# Patient Record
Sex: Female | Born: 1960 | Race: White | Hispanic: No | Marital: Married | Smoking: Current every day smoker
Health system: Southern US, Community
[De-identification: ages and names within clinical notes are randomized; demographics above are authoritative.]

## PROBLEM LIST (undated history)

## (undated) DIAGNOSIS — Z9189 Other specified personal risk factors, not elsewhere classified: Secondary | ICD-10-CM

## (undated) DIAGNOSIS — I82409 Acute embolism and thrombosis of unspecified deep veins of unspecified lower extremity: Secondary | ICD-10-CM

## (undated) DIAGNOSIS — G43909 Migraine, unspecified, not intractable, without status migrainosus: Secondary | ICD-10-CM

## (undated) DIAGNOSIS — E049 Nontoxic goiter, unspecified: Secondary | ICD-10-CM

## (undated) DIAGNOSIS — I10 Essential (primary) hypertension: Secondary | ICD-10-CM

## (undated) DIAGNOSIS — R109 Unspecified abdominal pain: Secondary | ICD-10-CM

## (undated) DIAGNOSIS — F319 Bipolar disorder, unspecified: Secondary | ICD-10-CM

## (undated) DIAGNOSIS — J301 Allergic rhinitis due to pollen: Secondary | ICD-10-CM

## (undated) DIAGNOSIS — E039 Hypothyroidism, unspecified: Secondary | ICD-10-CM

## (undated) DIAGNOSIS — F419 Anxiety disorder, unspecified: Secondary | ICD-10-CM

## (undated) DIAGNOSIS — T7840XA Allergy, unspecified, initial encounter: Secondary | ICD-10-CM

## (undated) DIAGNOSIS — I2510 Atherosclerotic heart disease of native coronary artery without angina pectoris: Secondary | ICD-10-CM

## (undated) DIAGNOSIS — F17200 Nicotine dependence, unspecified, uncomplicated: Secondary | ICD-10-CM

## (undated) DIAGNOSIS — R232 Flushing: Secondary | ICD-10-CM

## (undated) DIAGNOSIS — K219 Gastro-esophageal reflux disease without esophagitis: Secondary | ICD-10-CM

## (undated) HISTORY — DX: Nontoxic goiter, unspecified: E04.9

## (undated) HISTORY — DX: Anxiety disorder, unspecified: F41.9

## (undated) HISTORY — DX: Migraine, unspecified, not intractable, without status migrainosus: G43.909

## (undated) HISTORY — DX: Flushing: R23.2

## (undated) HISTORY — DX: Other specified personal risk factors, not elsewhere classified: Z91.89

## (undated) HISTORY — DX: Gastro-esophageal reflux disease without esophagitis: K21.9

## (undated) HISTORY — PX: THYROIDECTOMY: SHX17

## (undated) HISTORY — DX: Essential (primary) hypertension: I10

## (undated) HISTORY — DX: Nicotine dependence, unspecified, uncomplicated: F17.200

## (undated) HISTORY — DX: Allergic rhinitis due to pollen: J30.1

## (undated) HISTORY — DX: Allergy, unspecified, initial encounter: T78.40XA

## (undated) HISTORY — DX: Unspecified abdominal pain: R10.9

---

## 2003-04-22 ENCOUNTER — Other Ambulatory Visit: Admission: RE | Admit: 2003-04-22 | Discharge: 2003-04-22 | Payer: Self-pay | Admitting: Family Medicine

## 2003-05-06 ENCOUNTER — Encounter: Admission: RE | Admit: 2003-05-06 | Discharge: 2003-05-06 | Payer: Self-pay | Admitting: Family Medicine

## 2003-05-06 ENCOUNTER — Encounter: Payer: Self-pay | Admitting: Family Medicine

## 2006-06-06 ENCOUNTER — Ambulatory Visit: Admission: RE | Admit: 2006-06-06 | Discharge: 2006-06-06 | Payer: Self-pay | Admitting: Gynecologic Oncology

## 2006-10-26 ENCOUNTER — Ambulatory Visit: Payer: Self-pay | Admitting: Internal Medicine

## 2007-07-13 ENCOUNTER — Encounter: Payer: Self-pay | Admitting: *Deleted

## 2007-07-13 DIAGNOSIS — K219 Gastro-esophageal reflux disease without esophagitis: Secondary | ICD-10-CM | POA: Insufficient documentation

## 2007-07-13 DIAGNOSIS — G43909 Migraine, unspecified, not intractable, without status migrainosus: Secondary | ICD-10-CM | POA: Insufficient documentation

## 2007-07-13 DIAGNOSIS — J301 Allergic rhinitis due to pollen: Secondary | ICD-10-CM | POA: Insufficient documentation

## 2007-07-13 DIAGNOSIS — E049 Nontoxic goiter, unspecified: Secondary | ICD-10-CM | POA: Insufficient documentation

## 2007-07-13 DIAGNOSIS — F411 Generalized anxiety disorder: Secondary | ICD-10-CM | POA: Insufficient documentation

## 2007-07-13 DIAGNOSIS — T7840XA Allergy, unspecified, initial encounter: Secondary | ICD-10-CM | POA: Insufficient documentation

## 2007-10-18 ENCOUNTER — Ambulatory Visit: Payer: Self-pay | Admitting: Internal Medicine

## 2007-10-18 DIAGNOSIS — R109 Unspecified abdominal pain: Secondary | ICD-10-CM | POA: Insufficient documentation

## 2007-10-18 DIAGNOSIS — I10 Essential (primary) hypertension: Secondary | ICD-10-CM | POA: Insufficient documentation

## 2007-10-18 DIAGNOSIS — R232 Flushing: Secondary | ICD-10-CM | POA: Insufficient documentation

## 2007-10-18 LAB — CONVERTED CEMR LAB
Free T4: 0.7 ng/dL (ref 0.6–1.6)
PTH: 31.6 pg/mL (ref 14.0–72.0)
T3, Free: 3.2 pg/mL (ref 2.3–4.2)

## 2007-10-21 ENCOUNTER — Encounter: Payer: Self-pay | Admitting: Internal Medicine

## 2007-10-21 LAB — CONVERTED CEMR LAB
Catecholamines Tot(E+NE) 24 Hr U: 0.051 mg/24hr
Dopamine 24 Hr Urine: 256 mcg/24hr (ref <500)
Metanephrines, Ur: 143 (ref 58–203)

## 2007-10-23 ENCOUNTER — Encounter: Payer: Self-pay | Admitting: Internal Medicine

## 2007-10-24 ENCOUNTER — Encounter: Payer: Self-pay | Admitting: Internal Medicine

## 2007-10-28 ENCOUNTER — Telehealth: Payer: Self-pay | Admitting: Internal Medicine

## 2007-11-25 ENCOUNTER — Telehealth: Payer: Self-pay | Admitting: Internal Medicine

## 2008-07-22 ENCOUNTER — Telehealth (INDEPENDENT_AMBULATORY_CARE_PROVIDER_SITE_OTHER): Payer: Self-pay | Admitting: *Deleted

## 2009-01-19 ENCOUNTER — Encounter: Payer: Self-pay | Admitting: Internal Medicine

## 2009-02-18 ENCOUNTER — Encounter: Admission: RE | Admit: 2009-02-18 | Discharge: 2009-02-18 | Payer: Self-pay | Admitting: Obstetrics and Gynecology

## 2009-02-18 ENCOUNTER — Encounter: Payer: Self-pay | Admitting: Internal Medicine

## 2009-02-22 LAB — CONVERTED CEMR LAB: Pap Smear: NORMAL

## 2009-04-30 ENCOUNTER — Ambulatory Visit: Payer: Self-pay | Admitting: Internal Medicine

## 2009-04-30 DIAGNOSIS — F17200 Nicotine dependence, unspecified, uncomplicated: Secondary | ICD-10-CM

## 2009-07-24 HISTORY — PX: CARDIAC CATHETERIZATION: SHX172

## 2009-08-02 ENCOUNTER — Emergency Department (HOSPITAL_COMMUNITY): Admission: EM | Admit: 2009-08-02 | Discharge: 2009-08-03 | Payer: Self-pay | Admitting: Emergency Medicine

## 2009-08-05 ENCOUNTER — Ambulatory Visit: Payer: Self-pay | Admitting: Internal Medicine

## 2009-08-05 LAB — CONVERTED CEMR LAB
Calcium: 9.6 mg/dL (ref 8.4–10.5)
Chloride: 102 meq/L (ref 96–112)
GFR calc non Af Amer: 81.59 mL/min (ref >60)
Glucose, Bld: 97 mg/dL (ref 70–99)

## 2009-08-06 ENCOUNTER — Telehealth: Payer: Self-pay | Admitting: Internal Medicine

## 2009-08-09 ENCOUNTER — Telehealth: Payer: Self-pay | Admitting: Internal Medicine

## 2009-08-09 ENCOUNTER — Encounter: Payer: Self-pay | Admitting: Internal Medicine

## 2009-08-13 ENCOUNTER — Telehealth: Payer: Self-pay | Admitting: Internal Medicine

## 2009-08-25 ENCOUNTER — Encounter: Admission: RE | Admit: 2009-08-25 | Discharge: 2009-08-25 | Payer: Self-pay | Admitting: Obstetrics and Gynecology

## 2009-10-11 ENCOUNTER — Ambulatory Visit (HOSPITAL_COMMUNITY): Admission: RE | Admit: 2009-10-11 | Discharge: 2009-10-12 | Payer: Self-pay | Admitting: Surgery

## 2009-10-11 ENCOUNTER — Encounter (INDEPENDENT_AMBULATORY_CARE_PROVIDER_SITE_OTHER): Payer: Self-pay | Admitting: Surgery

## 2009-10-25 ENCOUNTER — Encounter: Payer: Self-pay | Admitting: Internal Medicine

## 2009-11-05 ENCOUNTER — Telehealth: Payer: Self-pay | Admitting: Internal Medicine

## 2009-12-06 ENCOUNTER — Encounter: Payer: Self-pay | Admitting: Internal Medicine

## 2009-12-08 ENCOUNTER — Telehealth: Payer: Self-pay | Admitting: Internal Medicine

## 2009-12-10 ENCOUNTER — Telehealth: Payer: Self-pay | Admitting: Internal Medicine

## 2009-12-21 ENCOUNTER — Encounter: Payer: Self-pay | Admitting: Internal Medicine

## 2009-12-21 ENCOUNTER — Telehealth: Payer: Self-pay | Admitting: Internal Medicine

## 2009-12-31 ENCOUNTER — Telehealth: Payer: Self-pay | Admitting: Internal Medicine

## 2010-02-08 ENCOUNTER — Telehealth: Payer: Self-pay | Admitting: Internal Medicine

## 2010-02-09 ENCOUNTER — Emergency Department (HOSPITAL_COMMUNITY): Admission: EM | Admit: 2010-02-09 | Discharge: 2010-02-09 | Payer: Self-pay | Admitting: Emergency Medicine

## 2010-02-09 ENCOUNTER — Encounter: Payer: Self-pay | Admitting: Internal Medicine

## 2010-02-11 ENCOUNTER — Ambulatory Visit: Payer: Self-pay | Admitting: Internal Medicine

## 2010-02-11 DIAGNOSIS — E890 Postprocedural hypothyroidism: Secondary | ICD-10-CM

## 2010-02-24 ENCOUNTER — Ambulatory Visit: Payer: Self-pay | Admitting: Internal Medicine

## 2010-02-24 ENCOUNTER — Telehealth: Payer: Self-pay | Admitting: Internal Medicine

## 2010-02-24 ENCOUNTER — Inpatient Hospital Stay (HOSPITAL_COMMUNITY): Admission: EM | Admit: 2010-02-24 | Discharge: 2010-02-26 | Payer: Self-pay | Admitting: Internal Medicine

## 2010-02-25 ENCOUNTER — Ambulatory Visit: Payer: Self-pay | Admitting: Internal Medicine

## 2010-03-07 ENCOUNTER — Encounter: Admission: RE | Admit: 2010-03-07 | Discharge: 2010-03-07 | Payer: Self-pay | Admitting: Internal Medicine

## 2010-03-07 LAB — HM MAMMOGRAPHY: HM Mammogram: NEGATIVE

## 2010-04-22 ENCOUNTER — Telehealth: Payer: Self-pay | Admitting: Internal Medicine

## 2010-05-18 ENCOUNTER — Ambulatory Visit: Payer: Self-pay | Admitting: Internal Medicine

## 2010-06-28 ENCOUNTER — Telehealth: Payer: Self-pay | Admitting: Internal Medicine

## 2010-06-28 ENCOUNTER — Ambulatory Visit: Payer: Self-pay | Admitting: Internal Medicine

## 2010-06-28 ENCOUNTER — Encounter: Payer: Self-pay | Admitting: Internal Medicine

## 2010-06-28 ENCOUNTER — Inpatient Hospital Stay (HOSPITAL_COMMUNITY)
Admission: EM | Admit: 2010-06-28 | Discharge: 2010-06-30 | Payer: Self-pay | Source: Home / Self Care | Attending: Cardiology | Admitting: Cardiology

## 2010-06-28 DIAGNOSIS — R0789 Other chest pain: Secondary | ICD-10-CM

## 2010-06-30 ENCOUNTER — Encounter: Payer: Self-pay | Admitting: Cardiology

## 2010-07-01 ENCOUNTER — Telehealth: Payer: Self-pay | Admitting: Internal Medicine

## 2010-07-01 ENCOUNTER — Telehealth: Payer: Self-pay | Admitting: Cardiovascular Disease

## 2010-07-05 ENCOUNTER — Ambulatory Visit: Payer: Self-pay | Admitting: Internal Medicine

## 2010-07-28 IMAGING — RF DG FLUORO GUIDE NDL PLC/BX
1 series · 1 of 1 positions shown · non-contrast
Comparison: none

CLINICAL DATA: 48-year-old female with severe headache.

DIAGNOSTIC LUMBAR PUNCTURE UNDER FLUOROSCOPIC GUIDANCE
TECHNIQUE: Informed consent was obtained from the patient prior to
the procedure, including potential complications of headache,
allergy, and pain.   A "time-out" was performed.  With the patient
prone, the lower back was prepped and draped in the usual sterile
fashion with Betadine.  1% Lidocaine was used for local anesthesia.
Using fluoroscopic guidance, lumbar puncture was performed at the
L2-L3 level using right sublaminar technique.

[Series 1: run · 1 of 1 slices shown]
[im 1/1]
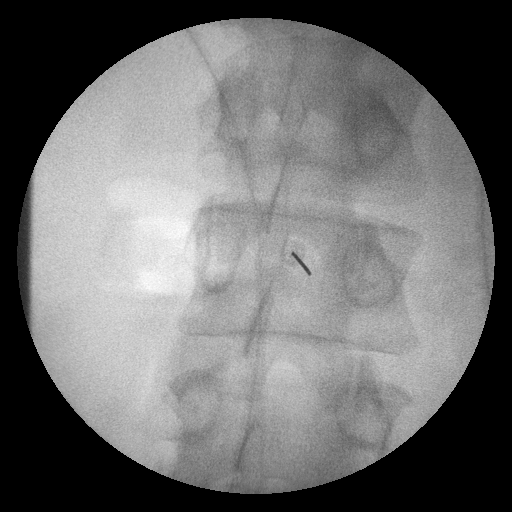

[1 of 1 positions shown; findings below may reference images not displayed]

A 3.5 inches x 20 gauge spinal needle was advanced into the thecal
sac with return of clear, colorless CSF.    Opening pressure was
measured to be 21 cm of water.  11 mL of CSF were collected into 4
tubes for laboratory studies.

The patient tolerated the procedure well and there were no apparent
complications.  The patient was given appropriate post procedural
instructions and return to the emergency department for continued
care.
IMPRESSION: Fluoroscopic-guided lumbar puncture at L2-L3 with collection of CSF
which was transported to the lab for analysis.

## 2010-07-29 ENCOUNTER — Ambulatory Visit: Admit: 2010-07-29 | Payer: Self-pay | Admitting: Internal Medicine

## 2010-08-23 ENCOUNTER — Telehealth: Payer: Self-pay | Admitting: Internal Medicine

## 2010-08-23 NOTE — Procedures (Signed)
Summary: Upper Endoscopy  Patient: [name] Note: All result statuses are Final unless otherwise noted.  Tests: (1) Upper Endoscopy (EGD)   EGD Upper Endoscopy       DONE     [facility]     [address] [place], [place]  [address]           ENDOSCOPY PROCEDURE REPORT           PATIENT:  [name], [name]  MR#:  [id number]     BIRTHDATE:  03-14-1961, 48 yrs. old  GENDER:  female           ENDOSCOPIST:  Hedwig Morton. Juanda Chance, MD     Referred by:  Rosalyn Gess. Norins, M.D.           PROCEDURE DATE:  02/25/2010     PROCEDURE:  EGD, diagnostic     ASA CLASS:  Class II     INDICATIONS:  melena, hematochezia, abdominal pain           MEDICATIONS:   Versed 10 mg, Fentanyl 100 mcg, Benadryl 50 mg     TOPICAL ANESTHETIC:  Cetacaine Spray           DESCRIPTION OF PROCEDURE:   After the risks benefits and     alternatives of the procedure were thoroughly explained, informed     consent was obtained.  The  endoscope was introduced through the     mouth and advanced to the second portion of the duodenum, without     limitations.  The instrument was slowly withdrawn as the mucosa     was fully examined.     <<PROCEDUREIMAGES>>           The upper, middle, and distal third of the esophagus were     carefully inspected and no abnormalities were noted. The z-line     was well seen at the GEJ. The endoscope was pushed into the fundus     which was normal including a retroflexed view. The antrum,gastric     body, first and second part of the duodenum were unremarkable (see     image1, image2, image3, image4, image5, image6, and image7).     Retroflexed views revealed no abnormalities.    The scope was then     withdrawn from the patient and the procedure completed.     COMPLICATIONS:  None           ENDOSCOPIC IMPRESSION:     1) Normal EGD     nothing to account for dark stools, suspect lower GI source, r/o     ischemic colitis     RECOMMENDATIONS:     see chart for  recommendations           REPEAT EXAM:  In 0 year(s) for.           ______________________________     Hedwig Morton. Juanda Chance, MD           CC:           n.     eSIGNED:   Hedwig Morton. Lester Crickenberger at 02/25/2010 05:08 PM           Barnett Hatter, [phone]  Note: An exclamation mark (!) indicates a result that was not dispersed into the flowsheet. Document Creation Date: 02/25/2010 5:10 PM _______________________________________________________________________  (1) Order result status: Final Collection or observation date-time: 02/25/2010 17:01 Requested date-time:  Receipt date-time:  Reported date-time:  Referring Physician:  Ordering Physician: Lina Sar [phone]) Specimen Source:  Source: Launa Grill Order Number: [id number] Lab site:

## 2010-08-23 NOTE — Progress Notes (Signed)
  Phone Note Outgoing Call   Call placed by: Ami Bullins CMA,  Dec 10, 2009 2:46 PM Call placed to: Patient Summary of Call: pt called re. med refill. lmovm for pt to call back Initial call taken by: Ami Bullins CMA,  Dec 10, 2009 2:46 PM    Prescriptions: ZYRTEC ALLERGY 10 MG CAPS (CETIRIZINE HCL) one daily  #90 x 3   Entered by:   Ami Bullins CMA   Authorized by:   Jacques Navy MD   Signed by:   Bill Salinas CMA on 12/13/2009   Method used:   Electronically to        [facility] (retail)       [address]       [place], [place]  [address]       Ph: [phone] or [phone]       Fax: [phone]   RxID:   [id number] CLARINEX 5 MG TABS (DESLORATADINE) 1 once daily  #90 x 3   Entered by:   Bill Salinas CMA   Authorized by:   Jacques Navy MD   Signed by:   Bill Salinas CMA on 12/13/2009   Method used:   Electronically to        [facility] (retail)       [address]       [place], [place]  [address]       Ph: [phone] or [phone]       Fax: [phone]   RxID:   [id number]

## 2010-08-23 NOTE — Assessment & Plan Note (Signed)
Summary: ER FU ON BP /NWS   Vital Signs:  Patient profile:   50 year old female Height:      70 inches Weight:      198 pounds BMI:     28.51 O2 Sat:      97 % on Room air Temp:     96.7 degrees F oral Pulse rate:   74 / minute BP sitting:   156 / 100  (left arm) Cuff size:   large  Vitals Entered By: Bill Salinas CMA (August 05, 2009 10:47 AM)  O2 Flow:  Room air CC: pt here for ER follow up. Pt's primary diagnoses was a migraine headache. Pt states she has been expirancing elevated bp with dizziness and occasional heart palpatations. She does admitt that her job is very stressful/ ab   Primary Care Provider:  Jacques Navy MD  CC:  pt here for ER follow up. Pt's primary diagnoses was a migraine headache. Pt states she has been expirancing elevated bp with dizziness and occasional heart palpatations. She does admitt that her job is very stressful/ ab.  History of Present Illness: Patient presents for f/u after ED evaluation 08/03/09 for severe HA and HTN. she had CT brain - negative, LP negative, labs were normal except for a WBC 14.1 with normal differential. she had normal CXR, nl EKG. she was released with pain medication.  Since discharge she is still having a dull headache, palpitations, worsening numbness in her hands. Still taking percocet for pain but it is not relieving her HA but is causing nausea. She has not been monitoring her BP at home. She is not on BP meds - metoprolol was stopped due to HA.   Current Medications (verified): 1)  Alprazolam 1 Mg  Tabs (Alprazolam) .Marland Kitchen.. 1 By Mouth Q 8 As Needed 2)  Buspar 30 Mg  Tabs (Buspirone Hcl) .... Take 1/2 Tablet Two Times A Day 3)  Zyrtec Hives Relief 10 Mg Tabs (Cetirizine Hcl) .... 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 none  G0P0  Review of Systems       The patient complains of chest pain and headaches.  The patient denies anorexia, fever, weight loss, weight gain, vision loss, decreased hearing, hoarseness, syncope, dyspnea on exertion, peripheral edema, abdominal pain, hematochezia, severe indigestion/heartburn, incontinence, genital sores, suspicious skin lesions, depression, abnormal bleeding, and angioedema.    Physical Exam  General:  alert, well-developed, well-nourished, and well-hydrated.   Head:  normocephalic and atraumatic.   Eyes:  PERRLA, EOMI, fundi normal except for copper wiring. No disc abnormality Mouth:  pharynx pink and moist.   Neck:  large goiter Chest Wall:  No deformities, masses, or tenderness noted. Lungs:  Normal respiratory effort, chest expands symmetrically. Lungs are clear to auscultation, no crackles or wheezes. Heart:  Normal rate and regular rhythm. S1 and S2 normal without gallop, murmur, click, rub or other extra sounds. Neurologic:  alert & oriented X3, cranial nerves II-XII intact, gait normal, and DTRs symmetrical and normal.     Impression & Recommendations:  Problem # 1:  UNSPECIFIED ESSENTIAL HYPERTENSION (ICD-401.9) Primary problem is hypertension. She had a very thorough eval in the ED.  Plan - resume medication: CCCB and diuretic. Blood pressure check in 3-5 days.  The following medications were  removed from the medication list:    Metoprolol Tartrate 25 Mg Tabs (Metoprolol tartrate) .Marland Kitchen... 1 by mouth two times a day Her updated medication list for this problem includes:    Taztia Xt 240 Mg Xr24h-cap (Diltiazem hcl er beads) .Marland Kitchen... 1 by mouth once daily    Furosemide 20 Mg Tabs (Furosemide) .Marland Kitchen... 1 by mouth once daily  Orders: TLB-BMP (Basic Metabolic Panel-BMET) (80048-METABOL)  Problem # 2:  BLURRED VISION (ICD-368.8) Patient with blurred vision that is persistent. She has not been screened for DM. ER evaluation did not include chemistries.  Plan - Bmet and A1C  Addendum - A1C normal @5 .5% and normal chemistry.  Complete Medication List: 1)  Alprazolam 1 Mg Tabs (Alprazolam) .Marland Kitchen.. 1 by mouth q 8 as needed 2)  Buspar 30 Mg Tabs (Buspirone hcl) .... Take 1/2 tablet two times a day 3)  Zyrtec Hives Relief 10 Mg Tabs (Cetirizine hcl) .... Prn 4)  Taztia Xt 240 Mg Xr24h-cap (Diltiazem hcl er beads) .Marland Kitchen.. 1 by mouth once daily 5)  Furosemide 20 Mg Tabs (Furosemide) .Marland Kitchen.. 1 by mouth once daily  Other Orders: TLB-A1C / Hgb A1C (Glycohemoglobin) (83036-A1C) Prescriptions: FUROSEMIDE 20 MG TABS (FUROSEMIDE) 1 by mouth once daily  #30 x 12   Entered and Authorized by:   Jacques Navy MD   Signed by:   Tora Perches on 08/05/2009   Method used:   Electronically to        [facility] (retail)       [address]       [place], [place]  [address]       Ph: [phone] or [phone]       Fax: [phone]   RxID:   [id number] TAZTIA XT 240 MG XR24H-CAP (DILTIAZEM HCL ER BEADS) 1 by mouth once daily  #30 x 12   Entered and Authorized by:   Jacques Navy MD   Signed by:   Tora Perches on 08/05/2009   Method used:   Electronically to        [facility] (retail)       [address]       [place], [place]  [address]       Ph: [phone] or [phone]       Fax: [phone]   RxID:   [id number]

## 2010-08-23 NOTE — Letter (Signed)
   [facility] [address] [place], [place]  [address] Phone: [phone]      August 09, 2009   [facility] [address] [place], [place] [address]  RE:  LAB RESULTS  Dear  Ms. LARRANAGA,  The following is an interpretation of your most recent lab tests.  Please take note of any instructions provided or changes to medications that have resulted from your lab work.  ELECTROLYTES:  Good - no changes needed  KIDNEY FUNCTION TESTS:  Good - no changes needed  DIABETIC STUDIES:  Good - no changes needed Blood Glucose: 97   HgbA1C: 5.5     No blood sugar problem.  Hope you are doing better.    Sincerely Yours,    Jacques Navy MD

## 2010-08-23 NOTE — Assessment & Plan Note (Signed)
Summary: palpatations/chest pressure/SD   Vital Signs:  Patient profile:   50 year old female Height:      70 inches Weight:      196 pounds BMI:     28.22 O2 Sat:      98 % on Room air Temp:     98.5 degrees F oral Pulse rate:   58 / minute BP sitting:   120 / 70  (left arm) Cuff size:   regular  Vitals Entered By: Bill Salinas CMA (June 28, 2010 3:39 PM)  O2 Flow:  Room air CC: pt here with c/o heart palpatations with chest pain and pressure x 2 weeks/ ab   Primary Care Provider:  Jacques Navy MD  CC:  pt here with c/o heart palpatations with chest pain and pressure x 2 weeks/ ab.  History of Present Illness: Patient presents with a several day h/o intermittent chest pressure for several weeks but over the last 3 days she has more frequent and constant symptoms of chest pressure substernal region along with SOB,  but no diaphoresis, with mild jaw discomfort but no left arm pressure. She has increased discomfort with exertion. Her cardiac risks include HTN, mild weight problem. No family history, no lipid abnormality, going through menopause.   She is now for admission by [facility] cardiology to r/o for MI.  Current Medications (verified): 1)  Alprazolam 1 Mg  Tabs (Alprazolam) .Marland Kitchen.. 1 By Mouth Q 8 As Needed 2)  Zyrtec Allergy 10 Mg Caps (Cetirizine Hcl) .... One Daily 3)  Taztia Xt 360 Mg Xr24h-Cap (Diltiazem Hcl Er Beads) .Marland Kitchen.. 1 By Mouth Once Daily 4)  Furosemide 20 Mg Tabs (Furosemide) .Marland Kitchen.. 1 By Mouth Once Daily 5)  Levothyroxine Sodium 150 Mcg Tabs (Levothyroxine Sodium) .Marland Kitchen.. 1 By Mouth Once Daily 6)  Trazodone Hcl 50 Mg Tabs (Trazodone Hcl) .Marland Kitchen.. 1 By Mouth At Bedtime  Allergies (verified): No Known Drug Allergies  Past History:  Past Medical History: Last updated: 08/05/2009 TOBACCO ABUSE (ICD-305.1) ABDOMINAL PAIN, UNSPECIFIED SITE (ICD-789.00) FLUSHING (ICD-782.62) UNSPECIFIED ESSENTIAL HYPERTENSION (ICD-401.9) ALLERGY (ICD-995.3) ANXIETY  (ICD-300.00) GOITER (ICD-240.9) MIGRAINE HEADACHE (ICD-346.90) HAY FEVER (ICD-477.0) GERD (ICD-530.81) Hx of CHICKENPOX, HX OF AS CHILD (ICD-V15.9)      Past Surgical History: Last updated: 02/11/2010 Throidectomy March 11 (Dr. 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    Physical Exam  General:  WNWD whitre female who is uncomfortable Head:  Normocephalic and atraumatic without obvious abnormalities. No apparent alopecia or balding. Eyes:  No corneal or conjunctival inflammation noted. EOMI. Perrla. Funduscopic exam benign, without hemorrhages, exudates or papilledema. Vision grossly normal. Ears:  External ear exam shows no significant lesions or deformities.  Otoscopic examination reveals clear canals, tympanic membranes are intact bilaterally without bulging, retraction, inflammation or discharge. Hearing is grossly normal bilaterally. Neck:  supple, full ROM, and no thyromegaly.   Chest Wall:  No deformities, masses, or tenderness noted. Breasts:  deferred Lungs:  Normal respiratory effort, chest expands symmetrically. Lungs are clear to auscultation, no crackles or wheezes. Heart:  normal  rate, regular rhythm, no murmur, no JVD, and no HJR.   Abdomen:  soft, non-tender, normal bowel sounds, no guarding, and no rigidity.   Msk:  normal ROM, no joint tenderness, no joint swelling, and no joint warmth.    Pulses:  2+ RADIAL Extremities:  No clubbing, cyanosis, edema, or deformity noted with normal full range of motion of all joints.   Neurologic:  alert & oriented X3, cranial nerves II-XII intact, strength normal in all extremities, sensation intact to light touch, gait normal, and DTRs symmetrical and normal.   Skin:  turgor normal, color normal, no rashes, and no suspicious lesions.   Cervical Nodes:  no anterior cervical adenopathy and no posterior cervical adenopathy.   Psych:  Oriented X3, memory intact for recent and remote, normally interactive, and good eye contact.     Impression & Recommendations:  Problem # 1:  CHEST PAIN, ATYPICAL (ICD-786.59) Patient presenting with increasing, crescendo chest discomfort with response to sl nitro. Her EKG is without acute changes. Her risk factors are mild to moderate but her symptoms are very worrisome.  Plan - to [facility] ED for admission by [facility]  Complete Medication List: 1)  Alprazolam 1 Mg Tabs (Alprazolam) .Marland Kitchen.. 1 by mouth q 8 as needed 2)  Zyrtec Allergy 10 Mg Caps (Cetirizine hcl) .... One daily 3)  Taztia Xt 360 Mg Xr24h-cap (Diltiazem hcl er beads) .Marland Kitchen.. 1 by mouth once daily 4)  Furosemide 20 Mg Tabs (Furosemide) .Marland Kitchen.. 1 by mouth once daily 5)  Levothyroxine Sodium 150 Mcg Tabs (Levothyroxine sodium) .Marland Kitchen.. 1 by mouth once daily 6)  Trazodone Hcl 50 Mg Tabs (Trazodone hcl) .Marland Kitchen.. 1 by mouth at bedtime   Orders Added: 1)  Est. Patient Level IV [09811]

## 2010-08-23 NOTE — Assessment & Plan Note (Signed)
Summary: C/O FATIGUE/DIZZINESS/DISCUSS MEDS/KB   Vital Signs:  Patient profile:   50 year old female Height:      70 inches Weight:      201 pounds BMI:     28.94 O2 Sat:      95 % on Room air Temp:     98.1 degrees F oral Pulse rate:   77 / minute BP sitting:   130 / 78  (left arm) Cuff size:   large  Vitals Entered By: Bill Salinas CMA (February 11, 2010 4:32 PM)  O2 Flow:  Room air CC: pt here with c/o weakness and fatigue with dizziness/ ab   Primary Care Provider:  Jacques Navy MD  CC:  pt here with c/o weakness and fatigue with dizziness/ ab.  History of Present Illness: Patient presents due to feeling fatigued, irritable and out of sorts. She had a thyroidectomy in march. She did have lab on July 20th with a mildly elevated TSH and low T3.  On questioning she endorses: fatigue, irritability, emotionality, change in sleep pattern (not sleeping), perseveration, decreased libido, anhedonia, loss of appetite. She is on Busapr 15mg  two times a day and alprazolam 1 mg q 8 as needed. She reports that she has tried welbutrin and didn't like it; she has tried zoloft (dose unknown) which she said made her feel bad.   Current Medications (verified): 1)  Alprazolam 1 Mg  Tabs (Alprazolam) .Marland Kitchen.. 1 By Mouth Q 8 As Needed 2)  Buspar 30 Mg  Tabs (Buspirone Hcl) .... Take 1/2 Tablet Two Times A Day 3)  Zyrtec Allergy 10 Mg Caps (Cetirizine Hcl) .... One Daily 4)  Taztia Xt 240 Mg Xr24h-Cap (Diltiazem Hcl Er Beads) .Marland Kitchen.. 1 By Mouth Once Daily 5)  Furosemide 20 Mg Tabs (Furosemide) .Marland Kitchen.. 1 By Mouth Once Daily 6)  Synthroid 100 Mcg Tabs (Levothyroxine Sodium) .Marland Kitchen.. 1 Tab Once Daily 7)  Melatonin 3 Mg Tabs (Melatonin) .Marland Kitchen.. 1 Once Daily  Allergies (verified): No Known Drug Allergies  Past History:  Past Medical History: Last updated: 08/05/2009 TOBACCO ABUSE (ICD-305.1) ABDOMINAL PAIN, UNSPECIFIED SITE (ICD-789.00) FLUSHING (ICD-782.62) UNSPECIFIED ESSENTIAL HYPERTENSION  (ICD-401.9) ALLERGY (ICD-995.3) ANXIETY (ICD-300.00) GOITER (ICD-240.9) MIGRAINE HEADACHE (ICD-346.90) HAY FEVER (ICD-477.0) GERD (ICD-530.81) Hx of CHICKENPOX, HX OF AS CHILD (ICD-V15.9)      Family History: Last updated: 10/25/07 father-deceased @54 ; DM, complications, Tobacco abuse,Etoh mother- '44: HTN, goiter Neg-breast or colon cancer  Social History: Last updated: 2007/10/25 HSG, 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 Luisa Hart)  G0P0  Review of Systems       The patient complains of depression.  The patient denies anorexia, fever, weight loss, weight gain, chest pain, peripheral edema, abdominal pain, muscle weakness, abnormal bleeding, and angioedema.    Physical Exam  General:  heavy-set white female in no distress Head:  Normocephalic and atraumatic without obvious abnormalities. No apparent alopecia or balding. Eyes:  C&S cldear Neck:  some submental swelling. Barely visible surgical scar anterior neck.  Lungs:  normal respiratory effort.   Heart:  normal rate and regular rhythm.   Neurologic:  alert & oriented X3, cranial nerves II-XII intact, and gait normal.   Skin:  turgor normal and color normal.   Psych:  normally interactive, good eye contact, dysphoric affect, flat affect, subdued, and tearful.     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 1 by mouth once daily  Labs Reviewed: Done at [facility]- mild elevation in TSH and depressed FT3 HgBA1c: 5.5 (08/05/2009)  Plan - increase levothyroxine to daily          follow-up lab 4 weeks  Problem # 2:  DEPRESSION, SITUATIONAL (ICD-309.0) Although patient  says she isn't depressed she has mutliple vegative signs of depression. She has failed wellbutrin in the past and also zolft which made her feel spacey and flattened. We discussed how medications will work as mood stabilizers and can make people feel "flat," which is the trade-off for reducing the bothersome symptoms of depresson.  Plan - Trazodone 50mg  at bedtime x 5 then 100mg  at bedtime. If tolerated, better but not good enough will go to 50mg  AM and 100mg  PM. If she is intolerant I would rechallenge with SSRI.  Complete Medication List: 1)  Alprazolam 1 Mg Tabs (Alprazolam) .Marland Kitchen.. 1 by mouth q 8 as needed 2)  Buspar 30 Mg Tabs (Buspirone hcl) .... Take 1/2 tablet two times a day 3)  Zyrtec Allergy 10 Mg Caps (Cetirizine hcl) .... One daily 4)  Taztia Xt 240 Mg Xr24h-cap (Diltiazem hcl er beads) .Marland Kitchen.. 1 by mouth once daily 5)  Furosemide 20 Mg Tabs (Furosemide) .Marland Kitchen.. 1 by mouth once daily 6)  Levothyroxine Sodium 150 Mcg Tabs (Levothyroxine sodium) .Marland Kitchen.. 1 by mouth once daily 7)  Melatonin 3 Mg Tabs (Melatonin) .Marland Kitchen.. 1 once daily 8)  Trazodone Hcl 50 Mg Tabs (Trazodone hcl) .Marland Kitchen.. 1 by mouth at bedtime x 5 days, then 2 at bedtime x 5 days. may increase to 50mg  am, 100mg  pm if needed  Patient Instructions: 1)  Thyroid - lab reveals that you are hypothyroid and need a high dose of thyroid replacement. Plan - levothyroxine 150 micrograms once daily. Repeat TSH in 4 weeks. 2)  Depression - you have many of the vegative signs of depression: change in appetite, irritability, anhedonia, loss of libido, change in sleep pattern, loss of mental focus and perseveration, emotionality! Plan - trazodone 50mg  at bedtime x 5 days, then 100mg  at bedtime x 5 days. Reassess how you are doing and how you are tolerating medication. May increase if needed to 50mg  AM and 100mg  at bedtime. Stop the buspar - go to once a day for 5 days then stop. may continue alprazolam.  Prescriptions: TRAZODONE HCL 50 MG TABS (TRAZODONE HCL)  1 by mouth at bedtime x 5 days, then 2 at bedtime x 5 days. May increase to 50mg  AM, 100mg  PM if needed  #45 x 0   Entered and Authorized by:   Jacques Navy MD   Signed by:   Jacques Navy MD on 02/12/2010   Method used:   Electronically to        [facility] [address] (retail)       [address]       [place], [place]  [address]       Ph: [phone] or [phone]       Fax: [phone]   RxID:   [id number] LEVOTHYROXINE SODIUM 150 MCG TABS (LEVOTHYROXINE SODIUM) 1 by mouth once daily  #30 x 12   Entered and Authorized by:   Jacques Navy MD   Signed by:   Jacques Navy MD on 02/12/2010  Method used:   Electronically to        [facility] (retail)       [address]       [place], [place]  [address]       Ph: [phone] or [phone]       Fax: [phone]   RxID:   [id number]

## 2010-08-23 NOTE — Progress Notes (Signed)
Summary: OV TODAY  Phone Note Call from Patient   Summary of Call: Pt c/o palpatations & chest pressure - concerned it may be from her medications. Advise eval now - pt agreed & is on her way over.  Initial call taken by: Lamar Sprinkles, CMA,  June 28, 2010 2:49 PM  Follow-up for Phone Call        pt here for appt Follow-up by: Ami Bullins CMA,  June 28, 2010 3:32 PM

## 2010-08-23 NOTE — Progress Notes (Signed)
Summary: Allergy Med  Phone Note Call from Patient Call back at Work Phone [phone] Call back at ext [phone]   Summary of Call: Pt c/o constant nasal drainage and congestion. Zyrtec has not helped Initial call taken by: Lamar Sprinkles, CMA,  Dec 08, 2009 5:11 PM  Follow-up for Phone Call        clarinex 5 mg one once daily  #30, refill as needed.  Follow-up by: Jacques Navy MD,  Dec 08, 2009 6:12 PM  Additional Follow-up for Phone Call Additional follow up Details #1::        Rx sent in, Pt wants call at office # above tomorrow Additional Follow-up by: Lamar Sprinkles, CMA,  Dec 08, 2009 6:22 PM    Additional Follow-up for Phone Call Additional follow up Details #2::    Patient notified per MD..Marland KitchenMarland KitchenAlvy Beal Archie CMA  Dec 09, 2009 9:24 AM   New/Updated Medications: CLARINEX 5 MG TABS (DESLORATADINE) 1 once daily Prescriptions: CLARINEX 5 MG TABS (DESLORATADINE) 1 once daily  #90 x 3   Entered by:   Lamar Sprinkles, CMA   Authorized by:   Jacques Navy MD   Signed by:   Lamar Sprinkles, CMA on 12/08/2009   Method used:   Electronically to        [facility] [address] (retail)       [address]       [place], [place]  [address]       Ph: [phone] or [phone]       Fax: [phone]   RxID:   [id number]

## 2010-08-23 NOTE — Progress Notes (Signed)
  Phone Note Outgoing Call   Reason for Call: Discuss lab or test results Summary of Call: please call pt - chemistry panel and A1C are normal.  Thanks Initial call taken by: Jacques Navy MD,  August 06, 2009 5:28 AM  Follow-up for Phone Call        informed pt of results Follow-up by: Ami Bullins CMA,  August 06, 2009 9:58 AM

## 2010-08-23 NOTE — Progress Notes (Signed)
Summary: Refill--Alprazolam  Phone Note Refill Request Message from:  Fax from Pharmacy on December 31, 2009 3:50 PM  Refills Requested: Medication #1:  ALPRAZOLAM 1 MG  TABS 1 by mouth q 8 as needed Next Appointment Scheduled: none Initial call taken by: Lucious Groves,  December 31, 2009 3:50 PM  Follow-up for Phone Call        ok for refill x 5 Follow-up by: Jacques Navy MD,  December 31, 2009 6:43 PM    Prescriptions: ALPRAZOLAM 1 MG  TABS (ALPRAZOLAM) 1 by mouth q 8 as needed  #90 x 5   Entered by:   Lucious Groves   Authorized by:   Jacques Navy MD   Signed by:   Lucious Groves on 01/03/2010   Method used:   Telephoned to ...       [facility] [address] [address] (retail)       [address]       [place], [place]  [address]       Ph: [phone] or [phone]       Fax: [phone]   RxID:   [id number]

## 2010-08-23 NOTE — Medication Information (Signed)
Summary: Prior [facility] for Clarinex/[facility]  Prior [place] for Clarinex/[facility]   Imported By: Sherian Rein 12/27/2009 14:56:37  _____________________________________________________________________  External Attachment:    Type:   Image     Comment:   External Document

## 2010-08-23 NOTE — Letter (Signed)
Summary: [facility]   Imported By: Sherian Rein 11/09/2009 09:56:38  _____________________________________________________________________  External Attachment:    Type:   Image     Comment:   External Document

## 2010-08-23 NOTE — Progress Notes (Signed)
Summary: OV TODAY  Phone Note Call from Patient Call back at Work Phone [phone] Call back at wk# 103   Summary of Call: Pt was started on trazodone and c/o some dark blood in her stool. Spoke w/pt. She has had 3 large amount of diarrhea with what she describes as very dark blood. She has only tried [employer] w/little relief. Scheduled for f/u today for eval.  Initial call taken by: Lamar Sprinkles, CMA,  February 24, 2010 10:36 AM  Follow-up for Phone Call        OK to add on Follow-up by: Jacques Navy MD,  February 24, 2010 10:47 AM

## 2010-08-23 NOTE — Progress Notes (Signed)
Summary: ZYRTEC  Phone Note Call from Patient   Summary of Call: Patient is requesting rx for otc zyrtec.  Initial call taken by: Lamar Sprinkles, CMA,  November 05, 2009 9:31 AM    New/Updated Medications: ZYRTEC ALLERGY 10 MG CAPS (CETIRIZINE HCL) one daily Prescriptions: ZYRTEC ALLERGY 10 MG CAPS (CETIRIZINE HCL) one daily  #90 x 3   Entered by:   Lamar Sprinkles, CMA   Authorized by:   Jacques Navy MD   Signed by:   Lamar Sprinkles, CMA on 11/05/2009   Method used:   Electronically to        [facility] [address] (retail)       [address]       [place], [place]  [address]       Ph: [phone] or [phone]       Fax: [phone]   RxID:   [id number]

## 2010-08-23 NOTE — Assessment & Plan Note (Signed)
Summary: PER SARAH SCHED--BP: THIS AM:   178/100  AFTER 20 MIN  158/98...   Vital Signs:  Patient profile:   50 year old female Height:      70 inches Weight:      201 pounds BMI:     28.94 O2 Sat:      96 % on Room air Temp:     97.8 degrees F oral Pulse rate:   69 / minute BP sitting:   138 / 100  (left arm) Cuff size:   regular  Vitals Entered By: Bill Salinas CMA (May 18, 2010 9:43 AM)  O2 Flow:  Room air CC: pt here for evaluation of elevated BP/ ab   Primary Care Provider:  Jacques Navy MD  CC:  pt here for evaluation of elevated BP/ ab.  History of Present Illness: Patient presents for excursion of her blood pressure. Today at work she reports a reading of 170's/100's. She has had a cold and has been taking OTC meds that may have contained a decongestant. She has had a headache which she attributes to her cold. No epistaxis, no diploplia, neurologic symptoms.   Current Medications (verified): 1)  Alprazolam 1 Mg  Tabs (Alprazolam) .Marland Kitchen.. 1 By Mouth Q 8 As Needed 2)  Buspar 30 Mg  Tabs (Buspirone Hcl) .... Take 1/2 Tablet Two Times A Day 3)  Zyrtec Allergy 10 Mg Caps (Cetirizine Hcl) .... One Daily 4)  Taztia Xt 240 Mg Xr24h-Cap (Diltiazem Hcl Er Beads) .Marland Kitchen.. 1 By Mouth Once Daily 5)  Furosemide 20 Mg Tabs (Furosemide) .Marland Kitchen.. 1 By Mouth Once Daily 6)  Levothyroxine Sodium 150 Mcg Tabs (Levothyroxine Sodium) .Marland Kitchen.. 1 By Mouth Once Daily 7)  Melatonin 3 Mg Tabs (Melatonin) .Marland Kitchen.. 1 Once Daily 8)  Trazodone Hcl 50 Mg Tabs (Trazodone Hcl) .Marland Kitchen.. 1 By Mouth At Bedtime X 5 Days, Then 2 At Bedtime X 5 Days. May Increase To 50mg  Am, 100mg  Pm If Needed  Allergies (verified): No Known Drug Allergies  Past History:  Past Medical History: Last updated: 08/05/2009 TOBACCO ABUSE (ICD-305.1) ABDOMINAL PAIN, UNSPECIFIED SITE (ICD-789.00) FLUSHING (ICD-782.62) UNSPECIFIED ESSENTIAL HYPERTENSION (ICD-401.9) ALLERGY (ICD-995.3) ANXIETY (ICD-300.00) GOITER (ICD-240.9) MIGRAINE  HEADACHE (ICD-346.90) HAY FEVER (ICD-477.0) GERD (ICD-530.81) Hx of CHICKENPOX, HX OF AS CHILD (ICD-V15.9)      Past Surgical History: Last updated: 02/11/2010 Throidectomy March 11 (Dr. 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    Physical Exam  General:  Well-developed,well-nourished,in no acute distress; alert,appropriate and cooperative throughout examination Head:  normocephalic and atraumatic.   Eyes:  pupils equal and pupils round.   Lungs:  normal respiratory effort and normal breath sounds.   Heart:  normal rate and regular rhythm.   Skin:  turgor normal and color normal.   Psych:  Oriented X3, memory intact for recent and remote, and normally interactive.     Impression & Recommendations:  Problem # 1:  UNSPECIFIED ESSENTIAL HYPERTENSION (ICD-401.9) Poor control.  Plan - increase diltiazem XT to 360mg           if this fails will increase furosemide to 40mg   avoid decongestants.   Her updated medication list for this problem includes:    Taztia Xt 360 Mg Xr24h-cap (Diltiazem hcl er beads) .Marland Kitchen... 1 by mouth once daily    Furosemide 20 Mg Tabs (Furosemide) .Marland Kitchen... 1 by mouth once daily  Complete Medication List: 1)  Alprazolam 1 Mg Tabs (Alprazolam) .Marland Kitchen.. 1 by mouth q 8 as needed 2)  Zyrtec Allergy 10 Mg Caps (Cetirizine hcl) .... One daily 3)  Taztia Xt 360 Mg Xr24h-cap (Diltiazem hcl er beads) .Marland Kitchen.. 1 by mouth once  daily 4)  Furosemide 20 Mg Tabs (Furosemide) .Marland Kitchen.. 1 by mouth once daily 5)  Levothyroxine Sodium 150 Mcg Tabs (Levothyroxine sodium) .Marland Kitchen.. 1 by mouth once daily 6)  Trazodone Hcl 50 Mg Tabs (Trazodone hcl) .Marland Kitchen.. 1 by mouth at bedtime  Patient Instructions: 1)  Blood pressure - too high. Please be careful about cold products - AVOID decongestants. Will increase taztia XT to 360mg . Goal is blood pressures in the 130s/80s. If this doesn't get you there call me and will increase the furosemide to 40mg  daily. If that doesn't do it will need to consider a third agent.  Prescriptions: TAZTIA XT 360 MG XR24H-CAP (DILTIAZEM HCL ER BEADS) 1 by mouth once daily  #30 x 12   Entered and Authorized by:   Jacques Navy MD   Signed by:   Jacques Navy MD on 05/18/2010   Method used:   Electronically to        [facility] [address] (retail)       [address]       [place], [place]  [address]       Ph: [phone] or [phone]       Fax: [phone]   RxID:   [id number]    Orders Added: 1)  Est. Patient Level III [00867]

## 2010-08-23 NOTE — Progress Notes (Signed)
Summary: BP Update  Phone Note Call from Patient Call back at Work Phone [phone]   Summary of Call: Patient left message on triage with BP update. Per patient today her BP is 160/90. Please advise. Initial call taken by: Lucious Groves,  August 13, 2009 2:25 PM  Follow-up for Phone Call        continue present medication Follow-up by: Jacques Navy MD,  August 16, 2009 8:28 AM  Additional Follow-up for Phone Call Additional follow up Details #1::        Pt informed  Additional Follow-up by: Lamar Sprinkles, CMA,  August 16, 2009 2:07 PM

## 2010-08-23 NOTE — Progress Notes (Signed)
Summary: CALL   Phone Note Call from Patient   Summary of Call: Patient is requesting a call back.  Initial call taken by: Lamar Sprinkles, CMA,  February 08, 2010 1:44 PM  Follow-up for Phone Call        I spoke with patient and she c/o fatigue, sleepiness, dizziness, and recent thyroid removal. Patient is wondering if the above symptoms are due to her recent thyroid removal or her BP med. Patient made appt for Friday of this week to discuss with MD. Follow-up by: Lucious Groves CMA,  February 08, 2010 3:07 PM  Additional Follow-up for Phone Call Additional follow up Details #1::        Please have her come in for TSH, FT4 and metabolic before her appt. 780.79 Additional Follow-up by: Jacques Navy MD,  February 08, 2010 5:44 PM    Additional Follow-up for Phone Call Additional follow up Details #2::    Pt informed, she req that a lab order be sent to her work, they have a lab and it is no cost to pt. Order pending MD signature. To be faxed Charlena Cross [phone] Follow-up by: Lamar Sprinkles, CMA,  February 08, 2010 6:06 PM  Additional Follow-up for Phone Call Additional follow up Details #3:: Details for Additional Follow-up Action Taken: Order has been faxed to Attn. Vernona Rieger at [phone] Additional Follow-up by: Ami Bullins CMA,  February 09, 2010 9:48 AM

## 2010-08-23 NOTE — Progress Notes (Signed)
Summary: question about medication  Phone Note From Pharmacy Call back at [phone]   Caller: [facility] Summary of Call: Pharmacy calling with question aboutb pt Taztia Initial call taken by: Judie Grieve,  July 01, 2010 9:22 AM  Follow-up for Phone Call        Dr. Debby Bud prescribed this medication. Pharmacy is aware. Pt. said when she was discharged yesterday that the PA Ward Givens told her he would put her on another medication. There is no mention of this in the d/c instructions. Advised pt. to call us if she has any further questions/problems regarding Peyton Bottoms although she has been on this medication before her hospital admission. Whitney Maeola Sarah RN  July 01, 2010 11:02 AM    Follow-up by: Whitney Maeola Sarah RN,  July 01, 2010 10:13 AM

## 2010-08-23 NOTE — Initial Assessments (Signed)
Summary: dark bloody diarrhea/SD   Vital Signs:  Patient profile:   50 year old female Height:      70 inches Weight:      197 pounds BMI:     28.37 O2 Sat:      96 % on Room air Temp:     97.8 degrees F oral Pulse rate:   77 / minute BP sitting:   130 / 80  (left arm) Cuff size:   large  Vitals Entered By: Bill Salinas CMA (February 24, 2010 4:52 PM)  O2 Flow:  Room air CC: [name] here with c/o dark blood in stools x 3 days/ ab   Primary Care Provider:  Jacques Navy MD  CC:  [name] here with c/o dark blood in stools x 3 days/ ab.  History of Present Illness: Patient presents acutely to the office for a history of maroon colored loose stool with bright red blood.   Patient has been on trazodone for sleep issues and has been doing well. She had a bloody stool Monday  and since then has had several more stools with maroon colored loose stool as well as red blood. She has had lower abdominal cramping pain. She denies upper abdominal pain. She has no history of GI bleeds. She has had no chest pain, shortness of breath and actually reports that she feels pretty well, especially since she has been sleeping better.  On exam she has tenderness in the LUQ/epigastric region. She has flash heme positive stool on exam. She is now admitted with potential UGI bleed possibly exacerbated by platelet dysfunction that can be seen with use of trazodone.   Current Medications (verified): 1)  Alprazolam 1 Mg  Tabs (Alprazolam) .Marland Kitchen.. 1 By Mouth Q 8 As Needed 2)  Buspar 30 Mg  Tabs (Buspirone Hcl) .... Take 1/2 Tablet Two Times A Day 3)  Zyrtec Allergy 10 Mg Caps (Cetirizine Hcl) .... One Daily 4)  Taztia Xt 240 Mg Xr24h-Cap (Diltiazem Hcl Er Beads) .Marland Kitchen.. 1 By Mouth Once Daily 5)  Furosemide 20 Mg Tabs (Furosemide) .Marland Kitchen.. 1 By Mouth Once Daily 6)  Levothyroxine Sodium 150 Mcg Tabs (Levothyroxine Sodium) .Marland Kitchen.. 1 By Mouth Once Daily 7)  Melatonin 3 Mg Tabs (Melatonin) .Marland Kitchen.. 1 Once Daily 8)  Trazodone Hcl 50 Mg  Tabs (Trazodone Hcl) .Marland Kitchen.. 1 By Mouth At Bedtime X 5 Days, Then 2 At Bedtime X 5 Days. May Increase To 50mg  Am, 100mg  Pm If Needed  Allergies (verified): No Known Drug Allergies  Past History:  Past Medical History: Last updated: 08/05/2009 TOBACCO ABUSE (ICD-305.1) ABDOMINAL PAIN, UNSPECIFIED SITE (ICD-789.00) FLUSHING (ICD-782.62) UNSPECIFIED ESSENTIAL HYPERTENSION (ICD-401.9) ALLERGY (ICD-995.3) ANXIETY (ICD-300.00) GOITER (ICD-240.9) MIGRAINE HEADACHE (ICD-346.90) HAY FEVER (ICD-477.0) GERD (ICD-530.81) Hx of CHICKENPOX, HX OF AS CHILD (ICD-V15.9)      Past Surgical History: Last updated: 02/11/2010 Throidectomy March 11 (Dr. 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  The patient denies anorexia, fever, weight loss, weight gain, chest pain, syncope, dyspnea on exertion, hemoptysis, abdominal pain, severe indigestion/heartburn, hematuria, and incontinence.    Physical Exam  General:  WNWD white female in no  acute distress. Head:  normocephalic and atraumatic.   Eyes:  vision grossly intact, pupils equal, and pupils round.  C&S clear  Ears:  R ear normal and L ear normal.   Mouth:  good dentition, no gingival abnormalities, no dental plaque, and pharynx pink and moist.   Neck:  supple.   Lungs:  normal respiratory effort, normal breath sounds, no crackles, and no wheezes.   Heart:  normal rate, regular rhythm, and no murmur.   Abdomen:  BS + x 4; tender in the upper abdomen at epigastrum and LUQ. No guarding or rebound. No  distention. Rectal:  External hemorrhoids without inflammation or tenderness. Hard stool in the vault. Stool with flecks of red and flash heme positive.  Msk:  normal ROM, no joint swelling, no joint warmth, and no joint deformities.   Pulses:  2+ radial  Extremities:  No clubbing, cyanosis, edema, or deformity noted with normal full range of motion of all joints.   Neurologic:  alert & oriented X3, cranial nerves II-XII intact, strength normal in all extremities, sensation intact to light touch, and gait normal.   Skin:  turgor normal, color normal, no suspicious lesions, and no ulcerations.  Tattoos arms, RLQ abdomen. Cervical Nodes:  no anterior cervical adenopathy and no posterior cervical adenopathy.   Psych:  Oriented X3, memory intact for recent and remote, normally interactive, and good eye contact.     Impression & Recommendations:  Problem # 1:  MELENA (ICD-578.1)  Patient presenting with 4 days of melena. She is tender in the upper GI region. She has been on trazodone which has a precaution about platlet dysfunction. Concern is for an upper GI bleed. She is not symptomatic or in distress.  Plan - REgular admit           Lab - CBC, Type and hold, [name], PTT.           GI consult - may need EGD           Follow-up lab in AM: CBC. May need a bleeding time.           Hold trazodone.   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 Check TSH  Her updated medication list for this problem includes:    Levothyroxine Sodium 150 Mcg Tabs (Levothyroxine sodium) .Marland Kitchen... 1 by mouth once daily  Problem # 3:  UNSPECIFIED ESSENTIAL HYPERTENSION (ICD-401.9)  Her updated medication list for this problem includes:    Taztia Xt 240 Mg Xr24h-cap (Diltiazem hcl er beads) .Marland Kitchen... 1 by mouth once daily    Furosemide 20 Mg Tabs (Furosemide) .Marland Kitchen... 1 by mouth once daily  BP today: 130/80 Prior BP: 130/78  (02/11/2010)  Continue home medications.  Complete Medication List: 1)  Alprazolam 1 Mg Tabs (Alprazolam) .Marland Kitchen.. 1 by mouth q 8 as needed 2)  Buspar 30 Mg Tabs (Buspirone hcl) .... Take 1/2 tablet two times a day 3)  Zyrtec Allergy 10 Mg Caps (Cetirizine hcl) .... One daily 4)  Taztia Xt 240 Mg Xr24h-cap (Diltiazem hcl er beads) .Marland Kitchen.. 1 by mouth once daily 5)  Furosemide 20 Mg Tabs (Furosemide) .Marland Kitchen.. 1 by mouth once daily 6)  Levothyroxine Sodium 150 Mcg Tabs (Levothyroxine sodium) .Marland Kitchen.. 1 by mouth once daily 7)  Melatonin 3 Mg Tabs (Melatonin) .Marland Kitchen.. 1 once daily 8)  Trazodone Hcl 50 Mg Tabs (Trazodone hcl) .Marland Kitchen.. 1 by mouth at bedtime x 5 days, then  2 at bedtime x 5 days. may increase to 50mg  am, 100mg  pm if needed

## 2010-08-23 NOTE — Progress Notes (Signed)
  Phone Note Refill Request Message from:  Fax from Pharmacy on April 22, 2010 8:27 AM  Refills Requested: Medication #1:  TRAZODONE HCL 50 MG TABS 1 by mouth at bedtime x 5 days Please Advise refills  Initial call taken by: Ami Bullins CMA,  April 22, 2010 8:27 AM  Follow-up for Phone Call        ok for refill x 5 add'l Follow-up by: Jacques Navy MD,  April 22, 2010 12:56 PM    Prescriptions: TRAZODONE HCL 50 MG TABS (TRAZODONE HCL) 1 by mouth at bedtime x 5 days, then 2 at bedtime x 5 days. May increase to 50mg  AM, 100mg  PM if needed  #45 Each x 5   Entered by:   Ami Bullins CMA   Authorized by:   Jacques Navy MD   Signed by:   Bill Salinas CMA on 04/22/2010   Method used:   Electronically to        [facility]       [address]       [place], [place]  [address]       Ph: [phone] or [phone]       Fax: [phone]   RxID:   [id number]

## 2010-08-23 NOTE — Letter (Signed)
Summary: Out of Work  [facility]  [address] [place], [place] [address]   Phone: [phone]  Fax: [phone]    August 05, 2009   Employee:  Ernestine Conrad    To Whom It May Concern:   For Medical reasons, please excuse the above named employee from work for the following dates:  Start:   August 03, 2009  End:   August 09, 2009  If you need additional information, please feel free to contact our office.         Sincerely,    Jacques Navy MD

## 2010-08-23 NOTE — Letter (Signed)
Summary: [facility]   Imported By: Lester Cairo 01/04/2010 12:06:50  _____________________________________________________________________  External Attachment:    Type:   Image     Comment:   External Document

## 2010-08-23 NOTE — Progress Notes (Signed)
Summary: PA--[employer]  Phone Note From Pharmacy   Summary of Call: PA request--[employer]. Form completed. Initial call taken by: Lucious Groves,  Dec 21, 2009 10:08 AM  Follow-up for Phone Call        form faxed. Follow-up by: Daphane Shepherd,  December 23, 2009 8:48 AM

## 2010-08-23 NOTE — Progress Notes (Signed)
Summary: BP UPDATE  Phone Note Call from Patient   Caller: [phone] Cell # till 1:05 after call wk # Summary of Call: Pt called with BP update. BP checked at work was 158/100 at 12 today. She reports no symptoms, says she feels better than at last office visit. Pt takes her meds in the afternoon everyday.  Initial call taken by: Lamar Sprinkles, CMA,  August 09, 2009 12:16 PM  Follow-up for Phone Call        continue present meds - report back BP Friday or next Monday Follow-up by: Jacques Navy MD,  August 09, 2009 1:17 PM  Additional Follow-up for Phone Call Additional follow up Details #1::        Pt informed  Additional Follow-up by: Lamar Sprinkles, CMA,  August 09, 2009 1:31 PM

## 2010-08-23 NOTE — Progress Notes (Signed)
Summary: MED ?   Phone Note Call from Patient Call back at [facility] Phone [phone]   Summary of Call: Pt states she was being d/c'd on a different BP med. When she went to pharm med was same as she previously on. I see note to cardiology regarding this too. Are you aware of med change? Initial call taken by: Lamar Sprinkles, CMA,  July 01, 2010 10:21 AM  Follow-up for Phone Call        called patient: it is the same med by a different name: taztia XT 360 - Dilatiazem CD/XT 360 Follow-up by: Jacques Navy MD,  July 01, 2010 2:44 PM

## 2010-08-25 NOTE — Assessment & Plan Note (Signed)
Summary: post hosp per flag from men/cd   Vital Signs:  Patient profile:   50 year old female Height:      70 inches Weight:      197 pounds BMI:     28.37 O2 Sat:      95 % on Room air Temp:     97.1 degrees F oral Pulse rate:   66 / minute BP sitting:   122 / 82  (left arm) Cuff size:   regular  Vitals Entered By: Bill Salinas CMA (July 05, 2010 9:02 AM)  O2 Flow:  Room air CC: hosp follow up/ ab   Primary Care Provider:  Jacques Navy MD  CC:  hosp follow up/ ab.  History of Present Illness: Patient recently hospitalized for atypical chest pain. Hospital records and cath report reviewed. She came to cardiac cath via left radial artery. She had dominant RCA with mid-vessel 30% plague; LAD with several mid vessel 20% lesions; ramus with 20% lesion; nl EF. She has done well since D/C except for persistent pain in the left arm which just resolved today.  Patient has not smoked since the day of her admission and intends to remain abstemious. Sshe prefers to do this without nicotine replacement or  medication.   Current Medications (verified): 1)  Alprazolam 1 Mg  Tabs (Alprazolam) .Marland Kitchen.. 1 By Mouth Q 8 As Needed 2)  Zyrtec Allergy 10 Mg Caps (Cetirizine Hcl) .... One Daily 3)  Taztia Xt 360 Mg Xr24h-Cap (Diltiazem Hcl Er Beads) .Marland Kitchen.. 1 By Mouth Once Daily 4)  Furosemide 20 Mg Tabs (Furosemide) .Marland Kitchen.. 1 By Mouth Once Daily 5)  Levothyroxine Sodium 150 Mcg Tabs (Levothyroxine Sodium) .Marland Kitchen.. 1 By Mouth Once Daily 6)  Trazodone Hcl 50 Mg Tabs (Trazodone Hcl) .Marland Kitchen.. 1 By Mouth At Bedtime 7)  Aspir-Low 81 Mg Tbec (Aspirin) .Marland Kitchen.. 1 Tablet Daily 8)  Pravastatin Sodium 40 Mg Tabs (Pravastatin Sodium) .Marland Kitchen.. 1 Tablet At Bedtime  Allergies (verified): No Known Drug Allergies PMH-FH-SH reviewed-no changes except otherwise noted  Review of Systems       The patient complains of prolonged cough.  The patient denies anorexia, fever, weight loss, weight gain, chest pain, dyspnea on exertion,  headaches, abdominal pain, severe indigestion/heartburn, muscle weakness, difficulty walking, depression, and enlarged lymph nodes.    Physical Exam  General:  Well-developed,well-nourished,in no acute distress; alert,appropriate and cooperative throughout examination Head:  normocephalic and atraumatic.   Eyes:  vision grossly intact, pupils equal, and pupils round.   Neck:  supple.   Lungs:  normal respiratory effort, no accessory muscle use, normal breath sounds, and no wheezes.   Heart:  normal rate and regular rhythm.   Msk:  minor bruising left forearm with tenderness to palpation along the course of visible vessel. Nl ROM Pulses:  2+ left radial and ulnar pulses Neurologic:  alert & oriented X3, cranial nerves II-XII intact, and gait normal.   Skin:  turgor normal.  Bruising as noted Psych:  Oriented X3, memory intact for recent and remote, normally interactive, and good eye contact.     Impression & Recommendations:  Problem # 1:  CHEST PAIN, ATYPICAL (ICD-786.59) Patient with non-obstructive coronary disease.  Plan - rigorous risk reduction: smoking cessation - in progress  Lipid control - start Lipitor 40mg  daily with an LDL goal of 80 or less - follow-up lab in one month                                                 regular exercise  Complete Medication List: 1)  Alprazolam 1 Mg Tabs (Alprazolam) .Marland Kitchen.. 1 by mouth q 8 as needed 2)  Zyrtec Allergy 10 Mg Caps (Cetirizine hcl) .... One daily 3)  Taztia Xt 360 Mg Xr24h-cap (Diltiazem hcl er beads) .Marland Kitchen.. 1 by mouth once daily 4)  Furosemide 20 Mg Tabs (Furosemide) .Marland Kitchen.. 1 by mouth once daily 5)  Levothyroxine Sodium 150 Mcg Tabs (Levothyroxine sodium) .Marland Kitchen.. 1 by mouth once daily 6)  Trazodone Hcl 50 Mg Tabs (Trazodone hcl) .Marland Kitchen.. 1 by mouth at bedtime 7)  Aspir-low 81 Mg Tbec (Aspirin) .Marland Kitchen.. 1 tablet daily 8)  Lipitor 40 Mg Tabs (Atorvastatin calcium) .Marland Kitchen.. 1 by mouth qpm for  cholesterol Prescriptions: LIPITOR 40 MG TABS (ATORVASTATIN CALCIUM) 1 by mouth qPM for cholesterol  #30 x 12   Entered and Authorized by:   Jacques Navy MD   Signed by:   Jacques Navy MD on 07/06/2010   Method used:   Electronically to        [facility]retail[facility]       [address]       [place], [place]  [address]       Ph: [phone] or [phone]       Fax: [phone]   RxID:   [id number]    Orders Added: 1)  Est. Patient Level IV [34742]

## 2010-08-31 NOTE — Progress Notes (Signed)
  Phone Note Refill Request Message from:  Fax from Pharmacy on August 23, 2010 8:21 AM  Refills Requested: Medication #1:  ALPRAZOLAM 1 MG  TABS 1 by mouth q 8 as needed please Advise refills  Initial call taken by: Ami Bullins CMA,  August 23, 2010 8:21 AM  Follow-up for Phone Call        ok x 5 Follow-up by: Jacques Navy MD,  August 23, 2010 1:04 PM    Prescriptions: ALPRAZOLAM 1 MG  TABS (ALPRAZOLAM) 1 by mouth q 8 as needed  #90 x 5   Entered by:   Rock Nephew CMA   Authorized by:   Jacques Navy MD   Signed by:   Rock Nephew CMA on 08/23/2010   Method used:   Telephoned to ...       [facility] (retail)       [address]       [place], [place]  [address]       Ph: [phone] or [phone]       Fax: [phone]   RxID:   [id number]

## 2010-10-04 LAB — DIFFERENTIAL
Basophils Absolute: 0 10*3/uL (ref 0.0–0.1)
Basophils Relative: 0 % (ref 0–1)
Eosinophils Absolute: 0.1 10*3/uL (ref 0.0–0.7)
Lymphs Abs: 4.8 10*3/uL — ABNORMAL HIGH (ref 0.7–4.0)
Monocytes Absolute: 1 10*3/uL (ref 0.1–1.0)
Monocytes Relative: 9 % (ref 3–12)
Neutro Abs: 5 10*3/uL (ref 1.7–7.7)

## 2010-10-04 LAB — URINALYSIS, ROUTINE W REFLEX MICROSCOPIC
Leukocytes, UA: NEGATIVE
Nitrite: NEGATIVE
Specific Gravity, Urine: 1.027 (ref 1.005–1.030)

## 2010-10-04 LAB — URINE MICROSCOPIC-ADD ON

## 2010-10-04 LAB — CBC
HCT: 42.1 % (ref 36.0–46.0)
Hemoglobin: 14.7 g/dL (ref 12.0–15.0)
Hemoglobin: 15.7 g/dL — ABNORMAL HIGH (ref 12.0–15.0)
MCH: 30.9 pg (ref 26.0–34.0)
MCH: 31.2 pg (ref 26.0–34.0)
MCH: 31.8 pg (ref 26.0–34.0)
MCHC: 34.4 g/dL (ref 30.0–36.0)
MCV: 89.6 fL (ref 78.0–100.0)
MCV: 89.7 fL (ref 78.0–100.0)
Platelets: 347 10*3/uL (ref 150–400)
RBC: 4.62 MIL/uL (ref 3.87–5.11)
RBC: 4.7 MIL/uL (ref 3.87–5.11)
RBC: 5.03 MIL/uL (ref 3.87–5.11)
WBC: 10.9 10*3/uL — ABNORMAL HIGH (ref 4.0–10.5)

## 2010-10-04 LAB — BASIC METABOLIC PANEL
BUN: 18 mg/dL (ref 6–23)
BUN: 19 mg/dL (ref 6–23)
CO2: 25 mEq/L (ref 19–32)
CO2: 26 mEq/L (ref 19–32)
Calcium: 8.8 mg/dL (ref 8.4–10.5)
Calcium: 9.5 mg/dL (ref 8.4–10.5)
Chloride: 108 mEq/L (ref 96–112)
Chloride: 108 mEq/L (ref 96–112)
GFR calc Af Amer: >60 mL/min (ref >60)
GFR calc non Af Amer: >60 mL/min (ref >60)
Glucose, Bld: 111 mg/dL — ABNORMAL HIGH (ref 70–99)
Sodium: 137 mEq/L (ref 135–145)
Sodium: 139 mEq/L (ref 135–145)
Sodium: 140 mEq/L (ref 135–145)

## 2010-10-04 LAB — CK TOTAL AND CKMB (NOT AT ARMC)
CK, MB: 1.3 ng/mL (ref 0.3–4.0)
Total CK: 57 U/L (ref 7–177)

## 2010-10-04 LAB — PROTIME-INR
INR: 0.94 (ref 0.00–1.49)
Prothrombin Time: 12.8 seconds (ref 11.6–15.2)

## 2010-10-04 LAB — POCT CARDIAC MARKERS
Myoglobin, poc: 37.3 ng/mL (ref 12–200)
Myoglobin, poc: 39.2 ng/mL (ref 12–200)
Troponin i, poc: <0.05 ng/mL (ref 0.00–0.09)

## 2010-10-04 LAB — RENAL FUNCTION PANEL
BUN: 20 mg/dL (ref 6–23)
Calcium: 9.3 mg/dL (ref 8.4–10.5)
Creatinine, Ser: 0.75 mg/dL (ref 0.4–1.2)
Glucose, Bld: 99 mg/dL (ref 70–99)
Phosphorus: 2.8 mg/dL (ref 2.3–4.6)
Potassium: 3.7 mEq/L (ref 3.5–5.1)

## 2010-10-04 LAB — CARDIAC PANEL(CRET KIN+CKTOT+MB+TROPI)
Total CK: 51 U/L (ref 7–177)
Troponin I: 0.01 ng/mL (ref 0.00–0.06)

## 2010-10-04 LAB — MAGNESIUM: Magnesium: 2.1 mg/dL (ref 1.5–2.5)

## 2010-10-04 LAB — RAPID URINE DRUG SCREEN, HOSP PERFORMED
Barbiturates: NOT DETECTED
Benzodiazepines: POSITIVE — AB
Opiates: NOT DETECTED

## 2010-10-04 LAB — LIPID PANEL
Cholesterol: 202 mg/dL — ABNORMAL HIGH (ref 0–200)
HDL: 34 mg/dL — ABNORMAL LOW (ref >39)
LDL Cholesterol: 122 mg/dL — ABNORMAL HIGH (ref 0–99)
Total CHOL/HDL Ratio: 5.9 RATIO

## 2010-10-04 LAB — TROPONIN I: Troponin I: 0.01 ng/mL (ref 0.00–0.06)

## 2010-10-04 LAB — HEMOGLOBIN A1C: Mean Plasma Glucose: 117 mg/dL — ABNORMAL HIGH (ref <117)

## 2010-10-07 LAB — TYPE AND SCREEN
ABO/RH(D): A POS
Antibody Screen: NEGATIVE

## 2010-10-07 LAB — CBC
MCH: 32.7 pg (ref 26.0–34.0)
MCH: 32.9 pg (ref 26.0–34.0)
MCV: 93.4 fL (ref 78.0–100.0)
MCV: 93.6 fL (ref 78.0–100.0)
Platelets: 332 10*3/uL (ref 150–400)
Platelets: 346 10*3/uL (ref 150–400)
Platelets: 355 10*3/uL (ref 150–400)
RBC: 4.46 MIL/uL (ref 3.87–5.11)
RBC: 4.47 MIL/uL (ref 3.87–5.11)
RDW: 13 % (ref 11.5–15.5)
RDW: 13.1 % (ref 11.5–15.5)
RDW: 13.2 % (ref 11.5–15.5)
WBC: 11 10*3/uL — ABNORMAL HIGH (ref 4.0–10.5)

## 2010-10-07 LAB — BASIC METABOLIC PANEL
BUN: 20 mg/dL (ref 6–23)
CO2: 26 mEq/L (ref 19–32)
Chloride: 105 mEq/L (ref 96–112)
Creatinine, Ser: 1.02 mg/dL (ref 0.4–1.2)

## 2010-10-07 LAB — APTT: aPTT: 30 seconds (ref 24–37)

## 2010-10-08 LAB — DIFFERENTIAL
Basophils Absolute: 0 10*3/uL (ref 0.0–0.1)
Lymphocytes Relative: 24 % (ref 12–46)
Monocytes Absolute: 0.8 10*3/uL (ref 0.1–1.0)
Neutro Abs: 6.9 10*3/uL (ref 1.7–7.7)

## 2010-10-08 LAB — POCT CARDIAC MARKERS
CKMB, poc: 1.5 ng/mL (ref 1.0–8.0)
Myoglobin, poc: 47.4 ng/mL (ref 12–200)

## 2010-10-08 LAB — BASIC METABOLIC PANEL
BUN: 17 mg/dL (ref 6–23)
GFR calc non Af Amer: >60 mL/min (ref >60)
Potassium: 3.9 mEq/L (ref 3.5–5.1)
Sodium: 134 mEq/L — ABNORMAL LOW (ref 135–145)

## 2010-10-08 LAB — CBC
HCT: 47.6 % — ABNORMAL HIGH (ref 36.0–46.0)
RDW: 13.9 % (ref 11.5–15.5)
WBC: 10.3 10*3/uL (ref 4.0–10.5)

## 2010-10-08 LAB — D-DIMER, QUANTITATIVE: D-Dimer, Quant: <0.22 ug/mL-FEU (ref 0.00–0.48)

## 2010-10-08 LAB — TSH: TSH: 7.485 u[IU]/mL — ABNORMAL HIGH (ref 0.350–4.500)

## 2010-10-09 LAB — CBC
HCT: 48.7 % — ABNORMAL HIGH (ref 36.0–46.0)
MCHC: 35.1 g/dL (ref 30.0–36.0)
MCV: 92.1 fL (ref 78.0–100.0)
Platelets: 378 10*3/uL (ref 150–400)
RDW: 13.6 % (ref 11.5–15.5)

## 2010-10-09 LAB — CSF CELL COUNT WITH DIFFERENTIAL
RBC Count, CSF: 2 /mm3 — ABNORMAL HIGH
Tube #: 1
Tube #: 4
WBC, CSF: 0 /mm3 (ref 0–5)

## 2010-10-09 LAB — POCT CARDIAC MARKERS
CKMB, poc: <1 ng/mL — ABNORMAL LOW (ref 1.0–8.0)
Troponin i, poc: <0.05 ng/mL (ref 0.00–0.09)

## 2010-10-09 LAB — GRAM STAIN

## 2010-10-09 LAB — DIFFERENTIAL
Basophils Absolute: 0 10*3/uL (ref 0.0–0.1)
Eosinophils Absolute: 0 10*3/uL (ref 0.0–0.7)
Eosinophils Relative: 0 % (ref 0–5)

## 2010-10-09 LAB — CSF CULTURE W GRAM STAIN

## 2010-10-09 LAB — PROTEIN AND GLUCOSE, CSF: Glucose, CSF: 76 mg/dL (ref 43–76)

## 2010-10-10 ENCOUNTER — Ambulatory Visit: Payer: Self-pay | Admitting: Internal Medicine

## 2010-10-12 ENCOUNTER — Ambulatory Visit (INDEPENDENT_AMBULATORY_CARE_PROVIDER_SITE_OTHER): Payer: PRIVATE HEALTH INSURANCE | Admitting: Internal Medicine

## 2010-10-12 ENCOUNTER — Encounter: Payer: Self-pay | Admitting: Internal Medicine

## 2010-10-12 VITALS — BP 138/92 | HR 71 | Temp 97.9°F | Ht 70.0 in | Wt 186.0 lb

## 2010-10-12 DIAGNOSIS — Z7189 Other specified counseling: Secondary | ICD-10-CM

## 2010-10-12 DIAGNOSIS — Z716 Tobacco abuse counseling: Secondary | ICD-10-CM

## 2010-10-12 DIAGNOSIS — R413 Other amnesia: Secondary | ICD-10-CM

## 2010-10-12 NOTE — Progress Notes (Signed)
Subjective:    Patient ID: [name], female    DOB: 09-Apr-1961, 50 y.o.   MRN: [id number]  HPI [name] presents because she has had problems with memory: forgetting conversations; forgetting how to turn off her cell phone; at work have made 3 patient appointments and giving out appt card but not entering appt into computer system. This has been a problem for weeks to months.She has had no neurologic symptoms, no head injuries, no change in life-style, no use of substances of abuse. Her levels of anxiety have not really changed but do remain consistent. She has a long h/o anxiety and has tried and been intolerant of SSRI's, SNRI's, Wellbutrin, Buspar. She does take Xanax 1mg  most days and may on bad days take an additional dose.   She reports that she has been on a low carb low fat diet and has lost 7lbs!  She continues to work on smoking cessation. Today she seeks an opinion about the electronic cigarette. It is reasonable to try. She has not had any smoking cessation classes.   Past Medical History  Diagnosis Date  . GERD (gastroesophageal reflux disease)   . Anxiety   . Allergy   . Tobacco use disorder   . Abdominal pain, unspecified site   . Abdominal pain, unspecified site   . Flushing   . Unspecified essential hypertension   . Goiter, unspecified   . Migraine, unspecified, without mention of intractable migraine without mention of status migrainosus   . Allergic rhinitis due to pollen   . Unspecified personal history presenting hazards to health     History   Social History  . Marital Status: Married    Spouse Name: N/A    Number of Children: N/A  . Years of Education: N/A   Occupational History  . Not on file.   Social History Main Topics  . Smoking status: Current Everyday Smoker    Types: Cigarettes  . Smokeless tobacco: Not on file   Comment: Pt states she smokes anywhere from 3-5 cigarettes per day.   . Alcohol Use: Not on file  . Drug Use: Not on file    . Sexually Active: Not on file   Other Topics Concern  . 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    Review of Systems  Constitutional: Negative.   Respiratory: Positive for chest tightness and shortness of breath.   Cardiovascular: Negative.   Neurological: Negative.        Objective:   Physical Exam  Constitutional: She is oriented to person, place, and time. She appears well-developed and well-nourished.  HENT:  Head: Normocephalic and atraumatic.  Eyes: Conjunctivae and EOM are normal.  Neurological: She is alert and oriented to person, place, and time.       MMSE: 1. Day,date,year - ok 2. Content - ok 3. 5 # fwd-ok; 5# rev - 2 errors; 4# rev - ok 4. Serial 7's - ok; Nickles  - slow; change - ok 5. 3 word recall : 3/3 6. Naming - ok 7. Parables - 1 of 3 8. Judgement - ok 9. Clock face - fluid and accurate (see scanned)          Assessment & Plan:  1. Memory loss - normal MMSE indicating no cognitive deficit. Suspect her problem may be associated with stress and possibly a side effect  of benzodiazepines (xanax)  Plan - will have her try herbal alternatives for anxiety: Kava Kava, Passion flower. She is referred to [facility]            Will try to wean off xanax as she tries the above.            If herbals fail will try imipramine starting at 25mg  qhs  2. Smoking cessation - encouraged her in her efforts.  Plan - inquire as to eligibility to participate in [place] smoking cessation class  3. Weight management  Plan - continue current diet

## 2010-10-17 LAB — COMPREHENSIVE METABOLIC PANEL
ALT: 23 U/L (ref 0–35)
Alkaline Phosphatase: 85 U/L (ref 39–117)
BUN: 12 mg/dL (ref 6–23)
CO2: 26 mEq/L (ref 19–32)
Chloride: 104 mEq/L (ref 96–112)
GFR calc non Af Amer: >60 mL/min (ref >60)
Glucose, Bld: 97 mg/dL (ref 70–99)
Potassium: 4.4 mEq/L (ref 3.5–5.1)
Sodium: 138 mEq/L (ref 135–145)
Total Bilirubin: 0.5 mg/dL (ref 0.3–1.2)
Total Protein: 7.5 g/dL (ref 6.0–8.3)

## 2010-10-17 LAB — CBC
HCT: 47.6 % — ABNORMAL HIGH (ref 36.0–46.0)
Hemoglobin: 16.6 g/dL — ABNORMAL HIGH (ref 12.0–15.0)
RBC: 5.18 MIL/uL — ABNORMAL HIGH (ref 3.87–5.11)
RDW: 13.2 % (ref 11.5–15.5)

## 2010-10-17 LAB — DIFFERENTIAL
Basophils Absolute: 0 10*3/uL (ref 0.0–0.1)
Basophils Relative: 1 % (ref 0–1)
Eosinophils Absolute: 0 10*3/uL (ref 0.0–0.7)
Monocytes Relative: 12 % (ref 3–12)
Neutro Abs: 5.1 10*3/uL (ref 1.7–7.7)
Neutrophils Relative %: 62 % (ref 43–77)

## 2010-10-17 LAB — CALCIUM: Calcium: 9.1 mg/dL (ref 8.4–10.5)

## 2010-11-05 ENCOUNTER — Other Ambulatory Visit: Payer: Self-pay | Admitting: Internal Medicine

## 2010-11-21 ENCOUNTER — Other Ambulatory Visit: Payer: Self-pay | Admitting: Internal Medicine

## 2010-12-09 NOTE — Consult Note (Signed)
[name], [name]                ACCOUNT NO.:  [id number]   MEDICAL RECORD NO.:  [id number]          PATIENT TYPE:  OUT   LOCATION:  GYN                          FACILITY:  [facility]   PHYSICIAN:  Kathleen A. Duard Brady, MD    DATE OF BIRTH:  1961-01-31   DATE OF CONSULTATION:  06/06/2006  DATE OF DISCHARGE:                                 CONSULTATION   The patient is seen today in consultation at the request of Dr. Tamela Shannon.  Patient is a 50 year old gravida 1, para 0, aborta 1 who was  seen by Dr. Tamela Shannon as a new patient April 30, 2006.  At that  time, she was seen by Dr. Tamela Shannon for consideration of  hysterectomy secondary to severe cramping and hot flashes.  She states  that she has had multiple ultrasounds over several years documenting  fibroids as well as ovarian cysts; that both her younger sister and her  mother have had to have hysterectomy; therefore, she believe she needs  one.  Her cycles are not really regular.  She does have some fairly  regular spotting with her IUD.  She has had a Mirena IUD in for 4 years.  She describes her pain as wicked and unbearable.  She states her legs  become numb, she is tearful.  Initially, it was only for a few days a  month and associated with her menses.  But, for the past several months,  the pain is Shannon.  There is only a couple days per month where she  does not have pain.  She has been having pelvic pain for the past 7-8  years.  She states that she had doctors tell her that the pain is in her  head in the past.  Two years ago, she did have gonadotropins drawn,  which were not consistent with menopausal status.  She currently does  complain of night sweats and has hot flashes during the day; these have  been going on for about a year.  She has significant mood changes and  mood swings.  Her husband states that sometime she is very difficult to  live with and he needs to leave the house.  She is on BuSpar 30 mg  daily; however, has been off that for a period of time as she did not go  back to the doctor to have a prescription refilled.  She denies any  change in bowel or bladder habits; any bleeding from the rectum, the  bladder.  She denies any dyschezia.  She denies any dyspareunia.   ALLERGIES:  NONE.   MEDICATIONS:  BuSpar 30 mg daily.   PAST MEDICAL HISTORY:  None.   PAST SURGICAL HISTORY:  None.   SOCIAL HISTORY:  She is married.  She does not work.  Her husband works  cable work for [employer].  She occasionally assists him.  She  smokes a pack per day; she has done for 30-40 years.  She is not  interested in quitting.  She drinks alcohol occasionally.   FAMILY HISTORY:  Significant for her father  has diabetes.   HEALTH MAINTENANCE:  She is up-to-date on her Pap smear; she had one in  October 2007.  Her last mammogram was in 2005 and it was negative.   PHYSICAL EXAMINATION:  VITAL SIGNS:  Weight 196 pounds, height 5 feet 10  inches, blood pressure 136/90, pulse 88.  GENERAL:  Well-nourished, well-developed female in no acute distress.  NECK:  Supple.  There is no lymphadenopathy, no thyromegaly.  LUNGS:  Clear to auscultation bilaterally.  CARDIOVASCULAR EXAM:  Regular rate and rhythm.  ABDOMEN:  She has a Mickey Mouse tattoo in the right lower quadrant.  Abdomen is soft, nontender, nondistended.  There are no palpable masses  or hepatosplenomegaly.  Groins are negative for adenopathy.  EXTREMITIES:  There is no edema.  CERVIX:  Bimanual examination of the cervix is palpably normal.  The  corpus of normal size, shape, consistency, freely mobile.  There is no  cervical motion tenderness.  There is no uterine tenderness.  There is  no adnexal masses.   ASSESSMENT:  A 50 year old with long history of pelvic pain.  She is  being considered for laparoscopic hysterectomy.  I discussed with her  and her husband that I will be more than happy to assist Dr. Jean RosenthalChristell Shannon, but  the decision for surgery will be up to the patient and Dr.  Tamela Shannon.  We will be available pending the schedule.  I also  discussed that this is an elective procedure and it may be after the  holidays before it can be scheduled.  In addition, I have discussed with  them that she does not have any central pelvic pain on examination, no  dyspareunia or cervical motion tenderness; therefore, I am not convinced  that hysterectomy may assist her pain; while I am optimistic that that  will be the case, there can be no guaranteed benefit.  She does  understand.  She would like to have her ovaries removed at the time of  hysterectomy.  Her questions and that of her husband were answered to  their satisfaction.  They currently do not have insurance and will be  speaking to the hospital regarding payment plans.  We will be available  to Dr. Tamela Shannon, and the appointment will be scheduled per her  office.      Kathleen A. Duard Brady, MD  Electronically Signed     PAG/MEDQ  D:  06/06/2006  T:  06/06/2006  Job:  [id number]   cc:   [name], M.D.  Fax: [phone]   [name], R.N.  [address]  [place], [place] [address]

## 2010-12-09 NOTE — Assessment & Plan Note (Signed)
[facility]                           PRIMARY CARE OFFICE NOTE   NAME:[name], [name]                       MRN:          [id number]  DATE:10/26/2006                            DOB:          October 04, 1960    HISTORY:  Ms. Gunnels is a 50 year old woman who presents to establish  for ongoing continuity care.  Her chief complaint is she is very anxious  having run out of her BuSpar.   PAST MEDICAL HISTORY:  1. Surgical: None.  2. Medical:      a.     Chicken pox as a child.  She is fully immunized.      b.     History of GERD.      c.     Hay fever and allergies.      d.     Migraine headaches since age 89 currently tapered off and       very infrequent.      e.     Goiter.      f.     Anxiety.  3. GYN: Patient is a gravida 1, para 0 with one TAB.  She has an IUD      which was placed to control her significant pain from dysmenorrhea      and fibroids.  Patient is taking Estrone as well.  Patient had been      followed by Dr. Antionette Char and presently is being set up      for laparoscopic-assisted vaginal hysterectomy.   HABITS:  Tobacco one pack per day.  Alcohol zero to 12 ounces per week  with some variability.   CURRENT MEDICATION:  BuSpar 15 mg b.i.d.   FAMILY HISTORY:  Mother with hypertension and thyroid disease.  Father  died in 37.  He had alcohol abuse, congestive heart failure and  insulin-dependent diabetes.  No family history for colon cancer, breast  cancer, lung cancer or heart disease.   SOCIAL HISTORY:  The patient is a high Garment/textile technologist.  She has been  married to her husband for 8 months.  She has no children.  She  currently works as a Chiropractor.   REVIEW OF SYSTEMS:  Negative for CONSTITUTIONAL, CARDIOVASCULAR,  RESPIRATORY, GI PROBLEMS.  GU per the HPI.  No MUSCULOSKELETAL  complaints.   LIMITED EXAMINATION:  VITAL SIGNS:  Temperature was 98.2, blood pressure  131/90, pulse 88, weight 200 pounds.  GENERAL APPEARANCE:  Well-nourished, heavyset, Caucasian woman in no  acute distress.  HEENT EXAM:  Normocephalic, atraumatic.  EACs and TMs were unremarkable.  Oropharynx is negative.  Dentition in good repair.  No buccal or palatal  lesions were noted.  Posterior pharynx was clear.  Conjunctivae and  sclerae were clear.  PERRLA.  EOMI.  Funduscopic exam is unremarkable.  NECK:  Supple without thyromegaly.  NODES:  No adenopathy was noted in the cervical or supraclavicular  regions.  CHEST:  No CVA tenderness.  LUNGS:  Clear.  CARDIOVASCULAR:  2+ radial pulse, no JVD or carotid bruits.  She has  quiet precordium with regular rate  and rhythm without murmurs, rubs, or  gallops.  ABDOMEN:  Soft.  No further examination conducted.   ASSESSMENT AND PLAN:  1. Anxiety.  The patient is given a refill prescription for her      buspirone at 15 mg b.i.d.  She is given a prescription for      alprazolam 0.25 mg take q.6 h. as needed.  2. Gynecology.  The patient is under the care of a gynecologist and is      scheduled for laparoscopic-assisted vaginal hysterectomy as noted.   The patient was asked to return to see me on an as-needed basis.  I have  adamantly encouraged her to stop smoking.  She does need to return at  some point for routine followup laboratory.     Rosalyn Gess Norins, MD  Electronically Signed    MEN/MedQ  DD: 10/27/2006  DT: 10/27/2006  Job #: [id number]

## 2011-01-02 ENCOUNTER — Other Ambulatory Visit: Payer: Self-pay | Admitting: Internal Medicine

## 2011-01-04 ENCOUNTER — Ambulatory Visit (INDEPENDENT_AMBULATORY_CARE_PROVIDER_SITE_OTHER): Payer: PRIVATE HEALTH INSURANCE | Admitting: Internal Medicine

## 2011-01-04 ENCOUNTER — Encounter: Payer: Self-pay | Admitting: Internal Medicine

## 2011-01-04 DIAGNOSIS — K5289 Other specified noninfective gastroenteritis and colitis: Secondary | ICD-10-CM

## 2011-01-04 DIAGNOSIS — Z136 Encounter for screening for cardiovascular disorders: Secondary | ICD-10-CM

## 2011-01-04 DIAGNOSIS — K529 Noninfective gastroenteritis and colitis, unspecified: Secondary | ICD-10-CM

## 2011-01-04 MED ORDER — PROMETHAZINE HCL 25 MG PO TABS: 25.0000 mg | ORAL_TABLET | Freq: Four times a day (QID) | ORAL | Status: AC | PRN

## 2011-01-04 NOTE — Progress Notes (Signed)
  Subjective:    Patient ID: [name], female    DOB: 1961/01/12, 50 y.o.   MRN: [id number]  HPI [name] presents acutely for three day h/o abdominal discomfort, nausea without emesis, and diarrhea with 3 loose stools daily - mixed solid and liquid. She has not been hydrating. She c/o chest pain described as a pressure pain.  No fevers at home, postive for HA, no sore throat, positive for dyspnea with no cough.  PMH, FamHx and SocHx reviewed for any changes and relevance.    Review of Systems Review of Systems  Constitutional:  Negative for fever, chills. HEENT:  Negative for hearing loss, ear pain, congestion, neck stiffness and postnasal drip. Negative for sore throat or swallowing problems. Negative for dental complaints.   Eyes: Negative for vision loss or change in visual acuity.  Respiratory: Negative for chest tightness and wheezing.   Cardiovascular: Negative for chest pain and palpitation. No decreased exercise tolerance Gastrointestinal:See HPI Genitourinary: Negative for urgency, frequency, flank pain and difficulty urinating.  Musculoskeletal: Negative for myalgias, back pain, arthralgias and gait problem.  Neurological: Negative for dizziness, tremors, weakness and headaches.  Hematological: Negative for adenopathy.  Psychiatric/Behavioral: Negative for behavioral problems and dysphoric mood.       Objective:   Physical Exam Supine 117/74 HR 52; sitting 138/84 HR 64; Standing 120/80 HR 76 Gen'l - WNWD overweight white woman who appears flushed and who is visible uncomfortable HEENT - TMs normal. Throat clear Neck - supple Chest - clear to auscultation Cor - RRR Abdomen - BS+ x 4, no guarding or rebound, mild diffuse tenderness with most sensitive area LLQ, no HSM       Assessment & Plan:  1. Viral Gastroenteritis - symptoms c/w gastroenteritis. Absence of fever mitigates against diverticulitis or acute abdomen. She is orthostatic.  Plan - Hydrate!! -  instructed to drink 8 oz fluid hourly while awake           BRAT diet           Call for pain, refractory fever or inability to keep down fluids.

## 2011-01-04 NOTE — Patient Instructions (Signed)
Viral gastroenteritis - very dehydrated on exam with a change in pulse from 52 supine to 76 standing. Plan - phenergan 25mg  orally every 6 hours as needed for nausea; hydrate!!! - 8 oz every hour while awake (gatoraid, ginger ale, apple juice), BRAT diet. You will feel a lot better once you are hydrated. For fever, chills, inability to keep down fluids or marked increase in abdominal pain CALL.   Gastroenteritis (Vomiting, Diarrhea, and/or Dehydration) Viral gastroenteritis is also known as stomach flu. This condition affects the stomach and intestinal tract. The illness typically lasts 3 to 8 days. Most people develop an immune response. This eventually gets rid of the virus. While this natural response develops, the virus can make you quite ill. The majority of those affected are young infants. CAUSES Diarrhea and vomiting are often caused by a virus. Medicines (antibiotics) that kill germs will not help unless there is also a germ (bacterial) infection. SYMPTOMS The most common symptom is diarrhea. This can cause severe loss of fluids (dehydration) and body salt (electrolyte) imbalance. TREATMENT Treatments for this illness are aimed at rehydration. Antidiarrheal medicines are not recommended. They do not decrease diarrhea volume and may be harmful. Usually, home treatment is all that is needed. The most serious cases involve vomiting so severely that you are not able to keep down fluids taken by mouth (orally). In these cases, intravenous (IV) fluids are needed. Vomiting with viral gastroenteritis is common, but it will usually go away with treatment. HOME CARE INSTRUCTIONS Small amounts of fluids should be taken frequently. Large amounts at one time may not be tolerated. Plain water may be harmful in infants and the elderly. Oral rehydration solutions (ORS) are available at pharmacies and grocery stores. ORS replace water and important electrolytes in proper proportions. Sports drinks are not as  effective as ORS and may be harmful due to sugars worsening diarrhea.  As a general guideline for children, replace any new fluid losses from diarrhea and/or vomiting with ORS as follows:   If your child weighs 22 pounds or under (10 kg or less), give 60-120 mL (1/4 - 1/2 cup or 2 - 4 ounces) of ORS for each diarrheal stool or vomiting episode.   If your child weighs more than 22 pounds (more than 10 kgs), give 120-240 mL (1/2 - 1 cup or 4 - 8 ounces) of ORS for each diarrheal stool or vomiting episode.   In a child with vomiting, it may be helpful to give the above ORS replacement in 5 mL (1 teaspoon) amounts every 5 minutes, then increase as tolerated.   While correcting for dehydration, children should eat normally. However, foods high in sugar should be avoided because this may worsen diarrhea. Large amounts of carbonated soft drinks, juice, gelatin desserts, and other highly sugared drinks should be avoided.   After correction of dehydration, other liquids that are appealing to the child may be added. Children should drink small amounts of fluids frequently and fluids should be increased as tolerated.   Adults should eat normally while drinking more fluids than usual. Drink small amounts of fluids frequently and increase as tolerated. Drink enough water and fluids to keep your urine clear or pale yellow. Broths, weak decaffeinated tea, lemon-lime soft drinks (allowed to go flat), and ORS replace fluids and electrolytes.  Avoid:  Carbonated drinks.   Juice.    Extremely hot or cold fluids.     Caffeine drinks.     Fatty, greasy foods.  Alcohol.   Tobacco.    Too much intake of anything at one time.     Gelatin desserts.      Probiotics are active cultures of beneficial bacteria. They may lessen the amount and number of diarrheal stools in adults. Probiotics can be found in yogurt with active cultures and in supplements.   Wash your hands well to avoid spreading bacteria and  viruses.   Antidiarrheal medicines are not recommended for infants and children.   Only take over-the-counter or prescription medicines for pain, discomfort, or fever as directed by your caregiver. Do not give aspirin to children.   For adults with dehydration, ask your caregiver if you should continue all prescribed and over-the-counter medicines.   If your caregiver has given you a follow-up appointment, it is very important to keep that appointment. Not keeping the appointment could result in a lasting (chronic) or permanent injury and disability. If there is any problem keeping the appointment, you must call to reschedule.  SEEK IMMEDIATE MEDICAL CARE IF:  You are unable to keep fluids down.   There is no urine output in 6 to 8 hours or there is only a small amount of very dark urine.   You develop shortness of breath.   There is blood in the vomit (may look like coffee grounds) or stool.   Belly (abdominal) pain develops, increases, or localizes.   There is persistent vomiting or diarrhea.   You or your child has an oral temperature above 100, not controlled by medicine.   Your baby is older than 3 months with a rectal temperature of 102 F (38.9 C) or higher.   Your baby is 8 months old or younger with a rectal temperature of 100.4 F (38 C) or higher.  MAKE SURE YOU:  Understand these instructions.   Will watch your condition.   Will get help right away if you are not doing well or get worse.  Document Released: 07/10/2005 Document Re-Released: 12/28/2009 [facility] Patient Information 2011 [employer], [place].

## 2011-01-06 ENCOUNTER — Telehealth: Payer: Self-pay

## 2011-01-06 ENCOUNTER — Encounter: Payer: Self-pay | Admitting: *Deleted

## 2011-01-06 NOTE — Telephone Encounter (Signed)
Printed excuse note waiting for md signature. Notified pt md ok note she states will pick up Monday. Will leave in cabinet up front.Marland KitchenMarland Kitchen6/15/12@ 4:21pm/LMB

## 2011-01-06 NOTE — Telephone Encounter (Signed)
Patient called [facility] stating that she is sick and had to stay home again from work. She is requesting an out of work note for today. Please advise if ok to provide

## 2011-01-06 NOTE — Telephone Encounter (Signed)
Sorry she isn't yet better. OK for work excuse.

## 2011-01-20 ENCOUNTER — Telehealth: Payer: Self-pay | Admitting: *Deleted

## 2011-01-20 MED ORDER — TRAZODONE HCL 50 MG PO TABS: 50.0000 mg | ORAL_TABLET | Freq: Every day | ORAL | Status: DC

## 2011-01-20 NOTE — Telephone Encounter (Signed)
Rx sent. Pt called

## 2011-01-20 NOTE — Telephone Encounter (Signed)
Pt left vm stating she is needing refill on her trazadone rx. Pt states she is taking 2 pills a day. Requesting # [phone] instead #[phone]

## 2011-02-04 IMAGING — CR DG CHEST 2V
2 series · 2 of 2 positions shown · non-contrast
Comparison: 10/11/2009

CLINICAL DATA: Chest pain, shortness of breath, cough, history
smoking, hypertension

CHEST - 2 VIEW

[w chest pa]
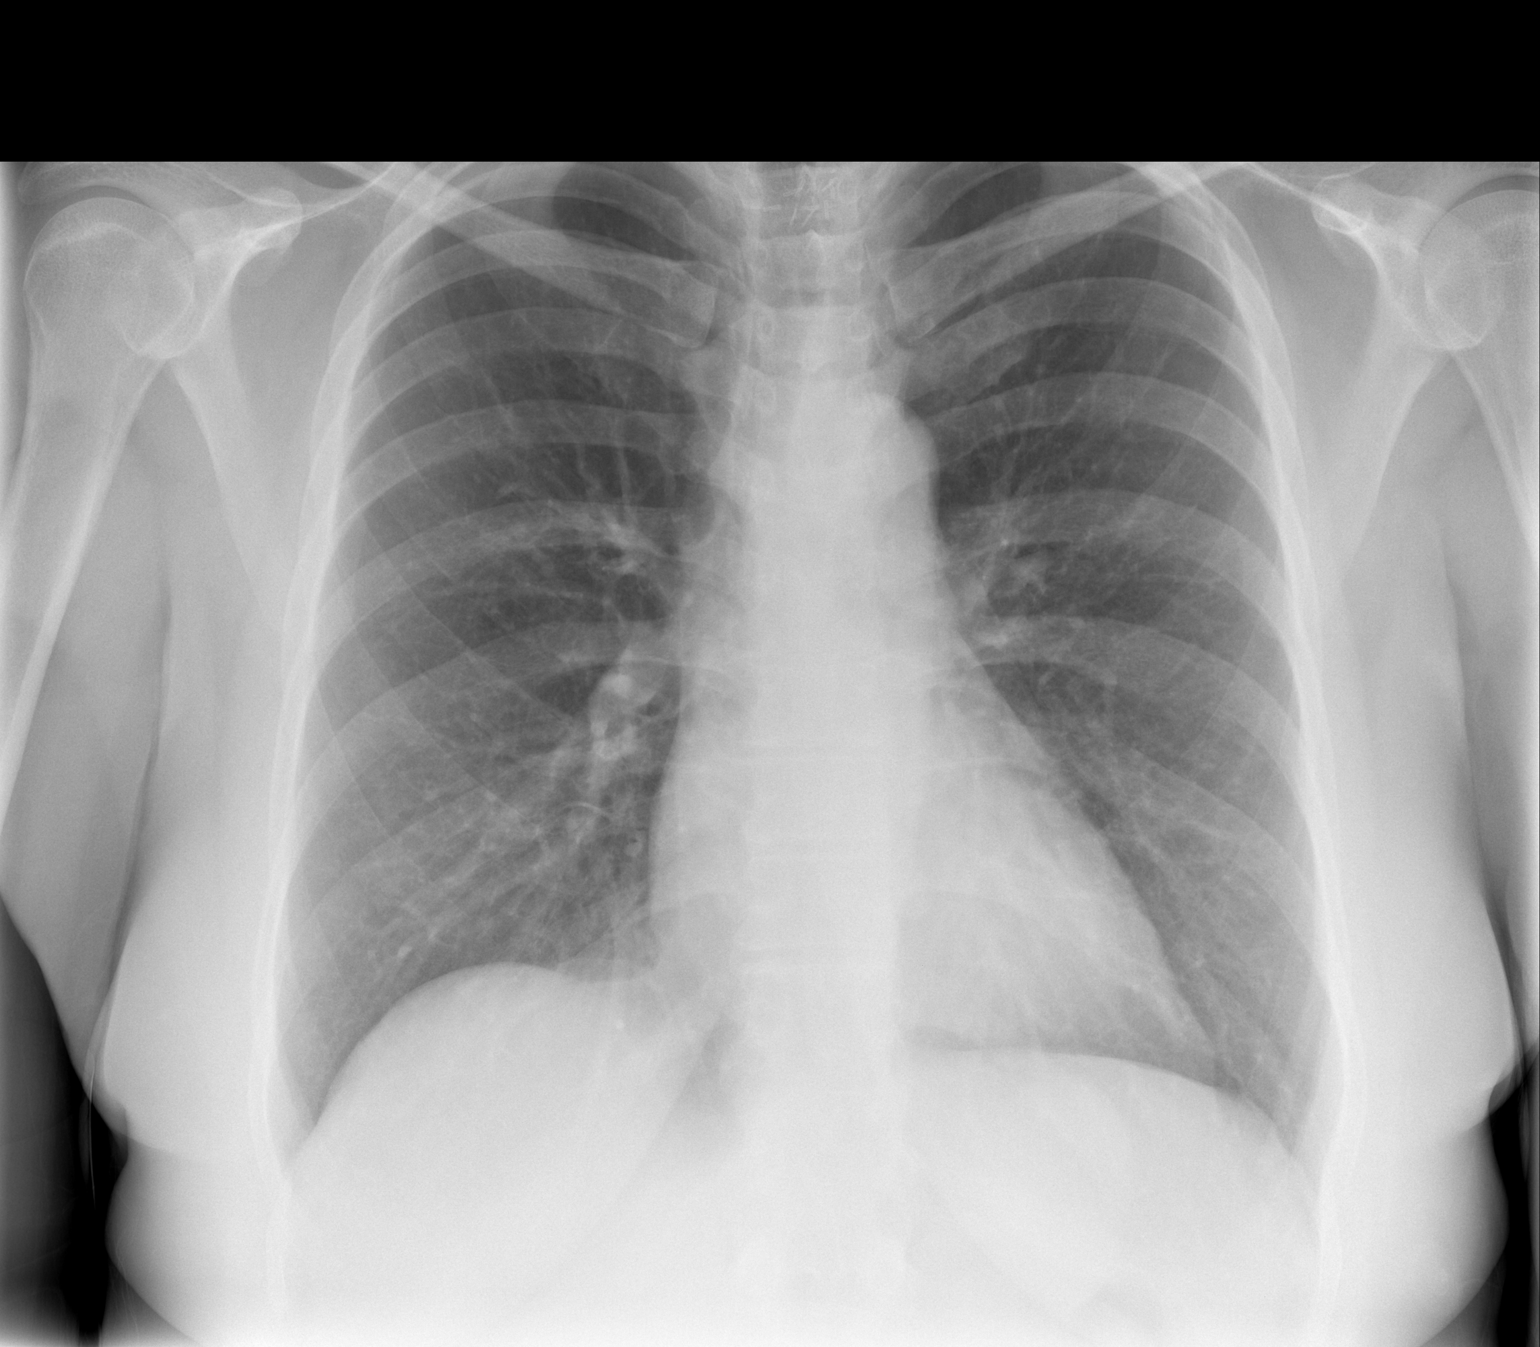

[w chest lat]
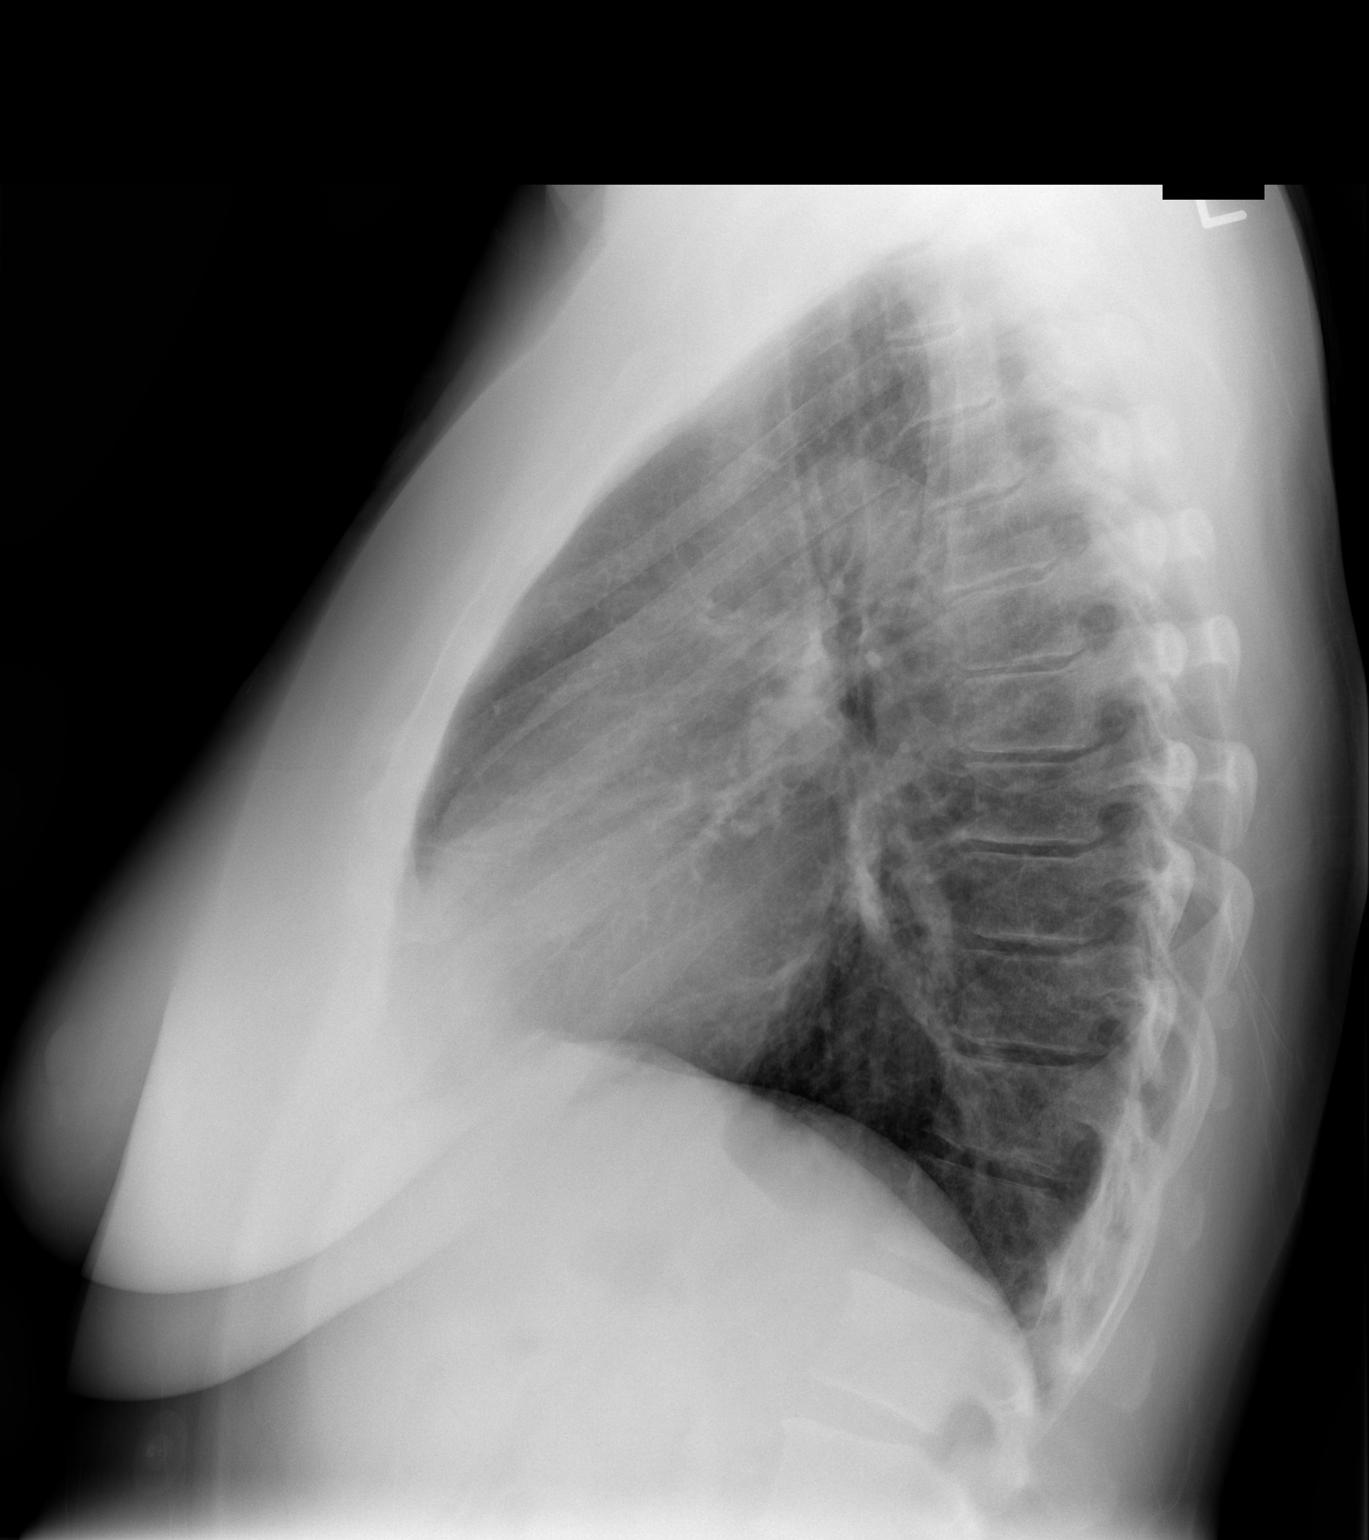

[2 of 2 positions shown; findings below may reference images not displayed]

FINDINGS: Normal heart size, mediastinal contours, and pulmonary vascularity.
Lungs clear.
No pleural effusion or pneumothorax.
Bones unremarkable.
IMPRESSION: No acute abnormalities.

## 2011-02-27 ENCOUNTER — Other Ambulatory Visit: Payer: Self-pay | Admitting: Internal Medicine

## 2011-03-13 ENCOUNTER — Other Ambulatory Visit: Payer: Self-pay | Admitting: Internal Medicine

## 2011-03-20 ENCOUNTER — Other Ambulatory Visit: Payer: Self-pay | Admitting: Internal Medicine

## 2011-03-20 NOTE — Telephone Encounter (Signed)
Ok for refill? 

## 2011-03-21 NOTE — Telephone Encounter (Signed)
Notified pharmacy spoke with Tammy ok x's 1 updated EPIC.Marland KitchenMarland Kitchen8/28/12@11 :25am/LMB

## 2011-03-23 ENCOUNTER — Telehealth: Payer: Self-pay

## 2011-03-23 NOTE — Telephone Encounter (Signed)
PA from [facility] called stating that abnormal labs were faxed 7/12 and patient needs a follow up appt. Appt set to see MD 03/24/11

## 2011-03-24 ENCOUNTER — Ambulatory Visit (INDEPENDENT_AMBULATORY_CARE_PROVIDER_SITE_OTHER): Payer: PRIVATE HEALTH INSURANCE | Admitting: Internal Medicine

## 2011-03-24 DIAGNOSIS — F411 Generalized anxiety disorder: Secondary | ICD-10-CM

## 2011-03-24 DIAGNOSIS — E890 Postprocedural hypothyroidism: Secondary | ICD-10-CM

## 2011-03-24 DIAGNOSIS — I10 Essential (primary) hypertension: Secondary | ICD-10-CM

## 2011-03-24 MED ORDER — FAMOTIDINE 20 MG PO TABS: 20.0000 mg | ORAL_TABLET | Freq: Two times a day (BID) | ORAL | Status: DC

## 2011-03-24 MED ORDER — LEVOTHYROXINE SODIUM 125 MCG PO TABS: 125.0000 ug | ORAL_TABLET | Freq: Every day | ORAL | Status: DC

## 2011-03-24 MED ORDER — NORTRIPTYLINE HCL 50 MG PO CAPS: ORAL_CAPSULE | ORAL | Status: DC

## 2011-03-24 NOTE — Patient Instructions (Signed)
Hypothyroidism - need a small adjustment just to get the numbers right. There is no problem that is contributing to your fatigue.  Anixety and sleep - plan: increase trazodone to 150 mg ( 3 tabs) at bedtime. Add nortriptyline 50 mg in the AM, after 3 days increase to 100 mg. If after 7 days you do not see enough improvement increase to 3 tabs (150 mg). If these changes do not help the next step is a psychiatric consult.   For substernal chest pain, which is most likely reflux, take Pepcid 20 mg twice a day.

## 2011-03-26 NOTE — Assessment & Plan Note (Signed)
Lab Results  Component Value Date   TSH 0.571 06/28/2010   Just a little over corrected on replacement - not to the point of being symptomatic  Plan - decrease synthroid to 125 mcg with next Rx

## 2011-03-26 NOTE — Progress Notes (Signed)
  Subjective:    Patient ID: [name], female    DOB: 07-08-1961, 50 y.o.   MRN: [id number]  HPI Mrs. Orlov presents with c/o persistent insomnia not effectively managed with trazodone 100mg  qhs; unrelenting anxiety - reviewed her history:she has tried and failed most SSRI, wellbutrin, Buspiron. Xanax 1 mg is not effective; generalized fatigue and malaise. She is having some difficulty coping and is on the verge of emotional exhaustion. She denies fever, chills, productive cough, weight loss. She is menopausal.  I have reviewed the patient's medical history in detail and updated the computerized patient record.    Review of Systems System review is negative for any constitutional, cardiac, pulmonary, GI or neuro symptoms or complaints     Objective:   Physical Exam Vitals reviewd - ok Gen'l - WNWD white woman in no medical distress Resp - normal and unlabored Cor 2 + radial pulse and RRR Neuro/psych- awake and alert, good historian with insight into her problems. Not suicidal.       Assessment & Plan:

## 2011-03-26 NOTE — Assessment & Plan Note (Signed)
BP Readings from Last 3 Encounters:  01/04/11 138/84  10/12/10 138/92  07/05/10 122/82   OK control on present medication. Continue present regimen.

## 2011-03-26 NOTE — Assessment & Plan Note (Signed)
See HPI - she has failed most of the SSRIs, dopanerigc drugs and anxiolytics. She is not sleeping and is unhappy at work. Counseling is not an option due to expense.  Plan - increase trazodone to  150 mg qhs           Add nortriptyline 50 mg qd x 3 then 100 mg qd x 7 and if needed increase to 150 mg           If therapy fails will need psychiatry consult for the use of atypical agents: lamictal, depakote,others

## 2011-03-31 ENCOUNTER — Telehealth: Payer: Self-pay | Admitting: *Deleted

## 2011-03-31 NOTE — Telephone Encounter (Signed)
Patient requesting a call regarding nortriptyline RX.

## 2011-03-31 NOTE — Telephone Encounter (Signed)
LMOAM

## 2011-04-04 NOTE — Telephone Encounter (Signed)
Spoke with patient. She started nortriptyline 9/2 and took med x 1 week. She did not increase dose due to side effects. Pt c/o increase in anxiety, dizziness, "foggy" thinking. She stopped med Friday. She was not able to work Monday or Tuesday due to side effects. I see in notes that if this does not help will seek psychiatric assistance.   1. She will have to refer herself, who do you suggest?  2. Any suggestions to help her get some sleep while waiting on an apt?

## 2011-04-04 NOTE — Telephone Encounter (Signed)
1st choice Dr. Emerson Monte  2nd choice Dr. Archer Asa  She has not done well with any sleep meds: can try xanax 1 mg qhs # 30

## 2011-04-04 NOTE — Telephone Encounter (Signed)
Left detailed VM for patient.

## 2011-06-05 ENCOUNTER — Other Ambulatory Visit: Payer: Self-pay | Admitting: Internal Medicine

## 2011-06-06 NOTE — Telephone Encounter (Signed)
OK for refill x 5. 

## 2011-07-10 ENCOUNTER — Other Ambulatory Visit: Payer: Self-pay | Admitting: Cardiovascular Disease

## 2011-08-09 ENCOUNTER — Other Ambulatory Visit: Payer: Self-pay | Admitting: Internal Medicine

## 2011-09-15 ENCOUNTER — Other Ambulatory Visit: Payer: Self-pay | Admitting: Internal Medicine

## 2011-11-03 ENCOUNTER — Other Ambulatory Visit: Payer: Self-pay | Admitting: Internal Medicine

## 2011-11-03 NOTE — Telephone Encounter (Signed)
Done

## 2012-01-10 ENCOUNTER — Other Ambulatory Visit: Payer: Self-pay | Admitting: Internal Medicine

## 2012-02-21 ENCOUNTER — Ambulatory Visit (INDEPENDENT_AMBULATORY_CARE_PROVIDER_SITE_OTHER): Payer: No Typology Code available for payment source | Admitting: Internal Medicine

## 2012-02-21 ENCOUNTER — Other Ambulatory Visit (INDEPENDENT_AMBULATORY_CARE_PROVIDER_SITE_OTHER): Payer: No Typology Code available for payment source

## 2012-02-21 ENCOUNTER — Encounter: Payer: Self-pay | Admitting: Internal Medicine

## 2012-02-21 VITALS — BP 130/86 | HR 67 | Temp 98.1°F | Resp 16 | Wt 200.0 lb

## 2012-02-21 DIAGNOSIS — R609 Edema, unspecified: Secondary | ICD-10-CM

## 2012-02-21 DIAGNOSIS — R220 Localized swelling, mass and lump, head: Secondary | ICD-10-CM

## 2012-02-21 DIAGNOSIS — I10 Essential (primary) hypertension: Secondary | ICD-10-CM

## 2012-02-21 DIAGNOSIS — E890 Postprocedural hypothyroidism: Secondary | ICD-10-CM

## 2012-02-21 LAB — COMPREHENSIVE METABOLIC PANEL
Alkaline Phosphatase: 68 U/L (ref 39–117)
BUN: 23 mg/dL (ref 6–23)
Glucose, Bld: 95 mg/dL (ref 70–99)
Sodium: 140 mEq/L (ref 135–145)
Total Bilirubin: 0.4 mg/dL (ref 0.3–1.2)
Total Protein: 8 g/dL (ref 6.0–8.3)

## 2012-02-21 MED ORDER — ALPRAZOLAM 1 MG PO TABS: 1.0000 mg | ORAL_TABLET | Freq: Three times a day (TID) | ORAL | Status: DC | PRN

## 2012-02-21 MED ORDER — CLOTRIMAZOLE-BETAMETHASONE 1-0.05 % EX CREA: TOPICAL_CREAM | Freq: Two times a day (BID) | CUTANEOUS | Status: DC

## 2012-02-21 NOTE — Patient Instructions (Addendum)
Swelling of the left face - concern for the parotid gland. Best study  -  CT neck with contrast. Will schedule for you. Will need lab today - renal function in order to get IV contrast.  Skin lesions - on your back there are several small lesions consistent with seborrheic keratosis - benign. Round red lesion on the right upper arm - could be fungal. Plan - a trial of lotrisone.   Thyroid - will check thyroid function.  Blood pressure looks great.  STOPPED SMOKING!!! YEAH!!! KEEP UP THE GOOD WORK.

## 2012-02-28 NOTE — Progress Notes (Signed)
Subjective:    Patient ID: [name], female    DOB: July 19, 1961, 51 y.o.   MRN: [id number]  HPI Mrs. [name] has STOPPED smoking!!  She presents for persistent and increasing swelling of the left face. She has had no pain, no new night sweats, no weight change, no lymphadenopathy, no otalgia, no dental problems or tooth pain, no swallowing difficulty.  Past Medical History  Diagnosis Date  . GERD (gastroesophageal reflux disease)   . Anxiety   . Allergy   . Tobacco use disorder   . Abdominal pain, unspecified site   . Abdominal pain, unspecified site   . Flushing   . Unspecified essential hypertension   . Goiter, unspecified   . Migraine, unspecified, without mention of intractable migraine without mention of status migrainosus   . Allergic rhinitis due to pollen   . Unspecified personal history presenting hazards to health    Past Surgical History  Procedure Date  . Thyroidectomy    Family History  Problem Relation Age of Onset  . Hypertension Mother   . Goiter Mother   . Diabetes Father   . Alcohol abuse Father   . Cancer Neg Hx     breast,colon   History   Social History  . Marital Status: Married    Spouse Name: N/A    Number of Children: N/A  . Years of Education: N/A   Occupational History  . Not on file.   Social History Main Topics  . Smoking status: Former Smoker    Types: Cigarettes    Quit date: 07/25/2011  . Smokeless tobacco: Not on file   Comment: Pt states she smokes anywhere from 3-5 cigarettes per day.   . Alcohol Use: Not on file  . Drug Use: Not on file  . Sexually Active: Not on file   Other Topics Concern  . 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 aspirin 81 MG EC tablet Take 81 mg by mouth daily.        . cetirizine (ZYRTEC) 10 MG tablet TAKE ONE  TABLET BY MOUTH EVERY DAY  90 tablet  1  . famotidine (PEPCID) 20 MG tablet TAKE ONE TABLET BY MOUTH TWICE DAILY  60 tablet  5  . furosemide (LASIX) 20 MG tablet TAKE ONE TABLET BY MOUTH EVERY DAY  30 tablet  5  . levothyroxine (SYNTHROID, LEVOTHROID) 125 MCG tablet Take 1 tablet (125 mcg total) by mouth daily.  30 tablet  11  . TAZTIA XT 360 MG 24 hr capsule TAKE ONE CAPSULE BY MOUTH EVERY DAY  30 each  6      Review of Systems System review is negative for any constitutional, cardiac, pulmonary, GI or neuro symptoms or complaints other than as described in the HPI.     Objective:   Physical Exam Filed Vitals:   02/21/12 1508  BP: 130/86  Pulse: 67  Temp: 98.1 F (36.7 C)  Resp: 16   Weight: 200 lb (90.719 kg)  Gen'l- WNWD white woman in no distress HEENT- a fullness of the left face in the area of the parotid gland is noted. The area is not tender and without nodularity. EAC/TM is normal. Oropharynx is normal. Nodes - negative in the submandibular or cervical region. Cor- RRR Pulm - normal respirations  Assessment & Plan:  Facial swelling - in the area of the parotid gland with no associated symptoms.  Plan - CT neck w/ contrast after consulting with radiologist as to best test to evaluate parotid gland and this general area.

## 2012-03-05 ENCOUNTER — Telehealth: Payer: Self-pay | Admitting: *Deleted

## 2012-03-05 NOTE — Telephone Encounter (Signed)
Message copied by [name] on Tue Mar 05, 2012  8:15 AM ------      Message from: [name]      Created: Fri Mar 01, 2012  4:51 AM       Call pt: all labs are normal

## 2012-03-05 NOTE — Telephone Encounter (Signed)
Left message on home VM that labs are normal

## 2012-03-07 ENCOUNTER — Ambulatory Visit (INDEPENDENT_AMBULATORY_CARE_PROVIDER_SITE_OTHER)
Admission: RE | Admit: 2012-03-07 | Discharge: 2012-03-07 | Disposition: A | Discharge status: Home / Self Care | Payer: No Typology Code available for payment source | Source: Ambulatory Visit | Attending: Internal Medicine | Admitting: Internal Medicine

## 2012-03-07 DIAGNOSIS — R609 Edema, unspecified: Secondary | ICD-10-CM

## 2012-03-07 DIAGNOSIS — R221 Localized swelling, mass and lump, neck: Secondary | ICD-10-CM

## 2012-03-07 DIAGNOSIS — R220 Localized swelling, mass and lump, head: Secondary | ICD-10-CM

## 2012-03-07 MED ORDER — IOHEXOL 300 MG/ML  SOLN
75.0000 mL | Freq: Once | INTRAMUSCULAR | Status: AC | PRN
  Administered 2012-03-07: 75 mL via INTRAVENOUS

## 2012-03-08 ENCOUNTER — Encounter: Payer: Self-pay | Admitting: Internal Medicine

## 2012-03-11 ENCOUNTER — Telehealth: Payer: Self-pay | Admitting: Internal Medicine

## 2012-03-11 NOTE — Telephone Encounter (Signed)
Caller: Lou/Patient; Patient Name: [name]; PCP: Illene Regulus; Best Callback Phone Number: [phone] Has not felt well for past few weeks with Nausea and feeling tired. Seen in office 3 weeks ago for facial swelling and CT Scan done on 03/07/12- has not yet heard from office regarding results of Scan. Notified that letter sent and reviewed results. Woke up during the night vomited several times - started with diarrhea at 0100-03/11/12. Feels nauseous, mouth dry, last voided this morning. Afebrile.  She did have some vomiting and flushed skin on 03/07/12 after given CT Contrast but sx resolved. Triage and Care Advice per Diarrhea or Other Change in Bowel Habits Protocol and appnt advised within 2 weeks if sx persist.

## 2012-04-06 ENCOUNTER — Other Ambulatory Visit: Payer: Self-pay | Admitting: Internal Medicine

## 2012-04-06 ENCOUNTER — Other Ambulatory Visit: Payer: Self-pay | Admitting: Cardiovascular Disease

## 2012-05-14 ENCOUNTER — Other Ambulatory Visit: Payer: Self-pay | Admitting: Cardiovascular Disease

## 2012-05-30 ENCOUNTER — Other Ambulatory Visit: Payer: Self-pay | Admitting: Cardiovascular Disease

## 2012-08-09 ENCOUNTER — Other Ambulatory Visit: Payer: Self-pay | Admitting: Internal Medicine

## 2012-08-14 ENCOUNTER — Ambulatory Visit (INDEPENDENT_AMBULATORY_CARE_PROVIDER_SITE_OTHER): Payer: No Typology Code available for payment source | Admitting: Internal Medicine

## 2012-08-14 ENCOUNTER — Encounter: Payer: Self-pay | Admitting: Internal Medicine

## 2012-08-14 VITALS — BP 120/72 | HR 82 | Temp 98.3°F | Resp 10 | Wt 199.1 lb

## 2012-08-14 DIAGNOSIS — L600 Ingrowing nail: Secondary | ICD-10-CM

## 2012-08-14 DIAGNOSIS — I10 Essential (primary) hypertension: Secondary | ICD-10-CM

## 2012-08-14 NOTE — Patient Instructions (Addendum)
In-grown nail causing pain.   Procedure: partial nail avulsion great toe. Care of wound: soak in warm water for 5-10 minutes, wash with soap and water. Regular band-aide Call for any fever, swelling, redness, pus, red streaks up the foot.   The nail will grow back slowly. Try to train the nail - little pledgetts under the nail to keep it from growing in. Let it grow out so it is flush with the end of the toe.

## 2012-08-14 NOTE — Progress Notes (Signed)
Subjective:    Patient ID: [name], female    DOB: 07/13/1961, 52 y.o.   MRN: [id number]  HPI [name] presents with a painful left great toe where she has an in-grown nail at the medial aspect. The toe is very sore so that it hurts to bear weight or wear shoes. Home care has not resolved the problem.  She has lost weight. Her BP has been in normal range off of medication.  Past Medical History  Diagnosis Date  . GERD (gastroesophageal reflux disease)   . Anxiety   . Allergy   . Tobacco use disorder   . Abdominal pain, unspecified site   . Abdominal pain, unspecified site   . Flushing   . Unspecified essential hypertension   . Goiter, unspecified   . Migraine, unspecified, without mention of intractable migraine without mention of status migrainosus   . Allergic rhinitis due to pollen   . Unspecified personal history presenting hazards to health    Past Surgical History  Procedure Date  . Thyroidectomy    Family History  Problem Relation Age of Onset  . Hypertension Mother   . Goiter Mother   . Diabetes Father   . Alcohol abuse Father   . Cancer Neg Hx     breast,colon   History   Social History  . Marital Status: Married    Spouse Name: N/A    Number of Children: N/A  . Years of Education: N/A   Occupational History  . Not on file.   Social History Main Topics  . Smoking status: Former Smoker    Types: Cigarettes    Quit date: 07/25/2011  . Smokeless tobacco: Not on file     Comment: Pt states she smokes anywhere from 3-5 cigarettes per day.   . Alcohol Use: Not on file  . Drug Use: Not on file  . Sexually Active: Not on file   Other Topics Concern  . 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 ALPRAZolam (XANAX) 1 MG tablet Take 1 tablet (1 mg total) by mouth 3 (three)  times daily as needed for sleep.  90 tablet  5  . aspirin 81 MG EC tablet Take 81 mg by mouth daily.        . cetirizine (ZYRTEC) 10 MG tablet TAKE ONE TABLET BY MOUTH EVERY DAY  90 tablet  0  . clotrimazole-betamethasone (LOTRISONE) cream Apply topically 2 (two) times daily.  30 g  0  . famotidine (PEPCID) 20 MG tablet TAKE ONE TABLET BY MOUTH TWICE DAILY  60 tablet  5  . levothyroxine (SYNTHROID, LEVOTHROID) 125 MCG tablet TAKE ONE TABLET BY MOUTH EVERY DAY  30 tablet  10      Review of Systems System review is negative for any constitutional, cardiac, pulmonary, GI or neuro symptoms or complaints other than as described in the HPI.     Objective:   Physical Exam Filed Vitals:   08/14/12 1503  BP: 120/72  Pulse: 82  Temp: 98.3 F (36.8 C)  Resp: 10   Wt Readings from Last 3 Encounters:  08/14/12 199 lb 1.3 oz (90.302 kg)  02/21/12 200 lb (90.719 kg)  01/04/11 184 lb (83.462 kg)   Gen';l - WNWD white woman in no distress Ext - left great toe with mild  erythema and very tender to touch.  Derm - mild thickening of the nail with fungal infection. The nail is curved and is digging into the soft tissue at the medial aspect of the toe.  Procedure - partial nail avulsion Permit - patient gave verbal permission after explanation of the procedure and risks of bleeding and infection Anesthesia - 2% xylocaine was infiltrated around the nail bed Using nail scission the nail was split and the medial aspect of the nail was avulsed Applied small pressure dressing. Gave wound care instructions (see AVS)       Assessment & Plan:  In-grown nail  Nail was successfully partial avulsed.

## 2012-08-14 NOTE — Assessment & Plan Note (Signed)
BP Readings from Last 3 Encounters:  08/14/12 120/72  02/21/12 130/86  01/04/11 138/84   Very good control off medication.  Plan - continue life-style management.

## 2012-08-29 ENCOUNTER — Encounter: Payer: Self-pay | Admitting: Internal Medicine

## 2012-09-07 ENCOUNTER — Other Ambulatory Visit: Payer: Self-pay

## 2012-09-17 ENCOUNTER — Other Ambulatory Visit: Payer: Self-pay | Admitting: Internal Medicine

## 2012-09-18 ENCOUNTER — Other Ambulatory Visit: Payer: Self-pay | Admitting: *Deleted

## 2012-09-19 ENCOUNTER — Other Ambulatory Visit: Payer: Self-pay | Admitting: *Deleted

## 2012-09-19 MED ORDER — ALPRAZOLAM 1 MG PO TABS: 1.0000 mg | ORAL_TABLET | Freq: Three times a day (TID) | ORAL | Status: DC | PRN

## 2012-09-19 NOTE — Telephone Encounter (Signed)
Last OV May '13. May have one month refill - no additional refills without OV

## 2012-10-18 ENCOUNTER — Other Ambulatory Visit: Payer: Self-pay | Admitting: Internal Medicine

## 2012-10-21 ENCOUNTER — Other Ambulatory Visit: Payer: Self-pay

## 2012-10-21 MED ORDER — CETIRIZINE HCL 10 MG PO TABS: 10.0000 mg | ORAL_TABLET | Freq: Every day | ORAL | Status: DC

## 2012-10-21 NOTE — Telephone Encounter (Signed)
Alprazolam called to [facility] pharmacy

## 2012-11-11 ENCOUNTER — Telehealth: Payer: Self-pay | Admitting: Internal Medicine

## 2012-11-11 NOTE — Telephone Encounter (Signed)
Patient Information:  Caller Name: Mattea  Phone: [phone]  Patient: [name]  Gender: Female  DOB: 1960/12/06  Age: 52 Years  PCP: Illene Regulus (Adults only)  Pregnant: No  Office Follow Up:  Does the office need to follow up with this patient?: No  Instructions For The Office: N/A  RN Note:  Pt agrees to office visit for eval of Cough.  Symptoms  Reason For Call & Symptoms: Pt has sxs of URI and she is now calling about CP.  Last PM chest felt tight.  Pt is relating the pain to Coughing.  Pt thinks she has fever, has not checked.  Pt denies CP however, states that she feels uncomfortable in chest.  Pt denies tightness.  Pt does have Nausea.  Pt denies SHOB.  Reviewed Health History In EMR: Yes  Reviewed Medications In EMR: Yes  Reviewed Allergies In EMR: Yes  Reviewed Surgeries / Procedures: Yes  Date of Onset of Symptoms: 11/10/2012 OB / GYN:  LMP: Unknown  Guideline(s) Used:  Cough  Disposition Per Guideline:   See Today in Office  Reason For Disposition Reached:   Severe coughing spells (e.g., whooping sound after coughing, vomiting after coughing)  Advice Given:  Call Back If:  You become worse.  Patient Refused Recommendation:  [facility] Will [facility] To [facility]  Pt states that she will go to [facility].

## 2012-12-31 ENCOUNTER — Other Ambulatory Visit: Payer: Self-pay | Admitting: Neurosurgery

## 2013-01-03 ENCOUNTER — Other Ambulatory Visit: Payer: Self-pay | Admitting: Internal Medicine

## 2013-01-03 NOTE — Telephone Encounter (Signed)
Alprazolam called to pharmacy  

## 2013-01-08 ENCOUNTER — Encounter (HOSPITAL_COMMUNITY): Payer: Self-pay | Admitting: Pharmacy Technician

## 2013-01-09 ENCOUNTER — Encounter (HOSPITAL_COMMUNITY)
Admission: RE | Admit: 2013-01-09 | Discharge: 2013-01-09 | Disposition: A | Discharge status: Home / Self Care | Payer: PRIVATE HEALTH INSURANCE | Source: Ambulatory Visit | Attending: Neurosurgery | Admitting: Neurosurgery

## 2013-01-09 ENCOUNTER — Encounter (HOSPITAL_COMMUNITY): Payer: Self-pay

## 2013-01-09 ENCOUNTER — Other Ambulatory Visit: Payer: Self-pay | Admitting: Neurosurgery

## 2013-01-09 ENCOUNTER — Encounter (HOSPITAL_COMMUNITY)
Admission: RE | Admit: 2013-01-09 | Discharge: 2013-01-09 | Disposition: A | Discharge status: Home / Self Care | Payer: PRIVATE HEALTH INSURANCE | Source: Ambulatory Visit | Attending: Anesthesiology | Admitting: Anesthesiology

## 2013-01-09 LAB — CBC
MCH: 31.9 pg (ref 26.0–34.0)
MCHC: 35.1 g/dL (ref 30.0–36.0)
Platelets: 375 10*3/uL (ref 150–400)
RBC: 4.98 MIL/uL (ref 3.87–5.11)

## 2013-01-09 LAB — SURGICAL PCR SCREEN
MRSA, PCR: NEGATIVE
Staphylococcus aureus: NEGATIVE

## 2013-01-09 LAB — BASIC METABOLIC PANEL
Calcium: 9.4 mg/dL (ref 8.4–10.5)
GFR calc Af Amer: >90 mL/min (ref >90)
GFR calc non Af Amer: >90 mL/min (ref >90)
Glucose, Bld: 115 mg/dL — ABNORMAL HIGH (ref 70–99)
Sodium: 133 mEq/L — ABNORMAL LOW (ref 135–145)

## 2013-01-09 NOTE — Pre-Procedure Instructions (Signed)
KAYRA CROWELL  01/09/2013   Your procedure is scheduled on:  Juen 25, 2014  Report to [facility] at 9 AM.  Call this number if you have problems the morning of surgery: [phone]   Remember:   Do not eat food or drink liquids after midnight.   Take these medicines the morning of surgery with A SIP OF WATER: famotidine (PEPCID) 20 MG tablet, levothyroxine (SYNTHROID,  LEVOTHROID) 125 MCG tablet, ALPRAZolam (XANAX) 1 MG tablet   Do not wear jewelry, make-up or nail polish.  Do not wear lotions, powders, or perfumes. You may wear deodorant.  Do not shave 48 hours prior to surgery. Men may shave face and neck.  Do not bring valuables to the hospital.  [facility] is not responsible for any belongings or valuables.  Contacts, dentures or bridgework may not be worn into surgery.  Leave suitcase in the car. After surgery it may be brought to your room.  For patients admitted to the hospital, checkout time is 11:00 AM the day of  discharge.   Patients discharged the day of surgery will not be allowed to drive  home.  Name and phone number of your driver: family/friend  Special Instructions: Shower using CHG 2 nights before surgery and the night before surgery.  If you shower the day of surgery use CHG.  Use special wash - you have one bottle of CHG for all showers.  You should use approximately 1/3 of the bottle for each shower.   Please read over the following fact sheets that you were given: Pain Booklet, Coughing and Deep Breathing and Surgical Site Infection Prevention

## 2013-01-14 MED ORDER — CEFAZOLIN SODIUM-DEXTROSE 2-3 GM-% IV SOLR: 2.0000 g | INTRAVENOUS | Status: DC

## 2013-01-14 MED ORDER — CEFAZOLIN SODIUM-DEXTROSE 2-3 GM-% IV SOLR
2.0000 g | INTRAVENOUS | Status: AC
  Administered 2013-01-15: 2 g via INTRAVENOUS
  Filled 2013-01-14: qty 50

## 2013-01-15 ENCOUNTER — Encounter (HOSPITAL_COMMUNITY): Payer: Self-pay | Admitting: Anesthesiology

## 2013-01-15 ENCOUNTER — Ambulatory Visit (HOSPITAL_COMMUNITY): Payer: PRIVATE HEALTH INSURANCE

## 2013-01-15 ENCOUNTER — Encounter (HOSPITAL_COMMUNITY)
Admission: RE | Disposition: A | Discharge status: Home / Self Care | Payer: Self-pay | Source: Ambulatory Visit | Attending: Neurosurgery

## 2013-01-15 ENCOUNTER — Observation Stay (HOSPITAL_COMMUNITY)
Admission: RE | Admit: 2013-01-15 | Discharge: 2013-01-16 | Disposition: A | Discharge status: Home / Self Care | Payer: PRIVATE HEALTH INSURANCE | Source: Ambulatory Visit | Attending: Neurosurgery | Admitting: Neurosurgery

## 2013-01-15 ENCOUNTER — Ambulatory Visit (HOSPITAL_COMMUNITY): Payer: PRIVATE HEALTH INSURANCE | Admitting: Anesthesiology

## 2013-01-15 DIAGNOSIS — E039 Hypothyroidism, unspecified: Secondary | ICD-10-CM | POA: Insufficient documentation

## 2013-01-15 DIAGNOSIS — M4712 Other spondylosis with myelopathy, cervical region: Principal | ICD-10-CM | POA: Insufficient documentation

## 2013-01-15 DIAGNOSIS — Z79899 Other long term (current) drug therapy: Secondary | ICD-10-CM | POA: Insufficient documentation

## 2013-01-15 DIAGNOSIS — F17200 Nicotine dependence, unspecified, uncomplicated: Secondary | ICD-10-CM | POA: Insufficient documentation

## 2013-01-15 DIAGNOSIS — Z01818 Encounter for other preprocedural examination: Secondary | ICD-10-CM | POA: Insufficient documentation

## 2013-01-15 DIAGNOSIS — Z01810 Encounter for preprocedural cardiovascular examination: Secondary | ICD-10-CM | POA: Insufficient documentation

## 2013-01-15 DIAGNOSIS — Z01812 Encounter for preprocedural laboratory examination: Secondary | ICD-10-CM | POA: Insufficient documentation

## 2013-01-15 DIAGNOSIS — M5000 Cervical disc disorder with myelopathy, unspecified cervical region: Secondary | ICD-10-CM | POA: Insufficient documentation

## 2013-01-15 DIAGNOSIS — G5600 Carpal tunnel syndrome, unspecified upper limb: Secondary | ICD-10-CM | POA: Insufficient documentation

## 2013-01-15 HISTORY — PX: ANTERIOR CERVICAL DECOMP/DISCECTOMY FUSION: SHX1161

## 2013-01-15 HISTORY — PX: CARPAL TUNNEL RELEASE: SHX101

## 2013-01-15 SURGERY — ANTERIOR CERVICAL DECOMPRESSION/DISCECTOMY FUSION 2 LEVELS
Anesthesia: General | Site: Wrist | Laterality: Right | Wound class: Clean

## 2013-01-15 MED ORDER — LEVOTHYROXINE SODIUM 125 MCG PO TABS
125.0000 ug | ORAL_TABLET | Freq: Every day | ORAL | Status: DC
  Administered 2013-01-16: 125 ug via ORAL
  Filled 2013-01-15 (×2): qty 1

## 2013-01-15 MED ORDER — MENTHOL 3 MG MT LOZG: 1.0000 | LOZENGE | OROMUCOSAL | Status: DC | PRN

## 2013-01-15 MED ORDER — NEOSTIGMINE METHYLSULFATE 1 MG/ML IJ SOLN
INTRAMUSCULAR | Status: DC | PRN
  Administered 2013-01-15: 3 mg via INTRAVENOUS

## 2013-01-15 MED ORDER — ACETAMINOPHEN 10 MG/ML IV SOLN: 1000.0000 mg | Freq: Once | INTRAVENOUS | Status: DC

## 2013-01-15 MED ORDER — GLYCOPYRROLATE 0.2 MG/ML IJ SOLN
INTRAMUSCULAR | Status: DC | PRN
  Administered 2013-01-15: 0.6 mg via INTRAVENOUS

## 2013-01-15 MED ORDER — HYDROXYZINE HCL 25 MG PO TABS: 50.0000 mg | ORAL_TABLET | ORAL | Status: DC | PRN

## 2013-01-15 MED ORDER — SODIUM CHLORIDE 0.9 % IJ SOLN
3.0000 mL | Freq: Two times a day (BID) | INTRAMUSCULAR | Status: DC
  Administered 2013-01-15: 3 mL via INTRAVENOUS

## 2013-01-15 MED ORDER — BUPIVACAINE HCL (PF) 0.25 % IJ SOLN
INTRAMUSCULAR | Status: DC | PRN
  Administered 2013-01-15 (×2): 5 mL

## 2013-01-15 MED ORDER — LORATADINE 10 MG PO TABS
10.0000 mg | ORAL_TABLET | Freq: Every day | ORAL | Status: DC
  Filled 2013-01-15: qty 1

## 2013-01-15 MED ORDER — LIDOCAINE HCL 4 % MT SOLN
OROMUCOSAL | Status: DC | PRN
  Administered 2013-01-15: 4 mL via TOPICAL

## 2013-01-15 MED ORDER — HYDROMORPHONE HCL PF 1 MG/ML IJ SOLN
INTRAMUSCULAR | Status: AC
  Filled 2013-01-15: qty 1

## 2013-01-15 MED ORDER — ONDANSETRON HCL 4 MG/2ML IJ SOLN: 4.0000 mg | Freq: Four times a day (QID) | INTRAMUSCULAR | Status: DC | PRN

## 2013-01-15 MED ORDER — OXYCODONE HCL 5 MG PO TABS
ORAL_TABLET | ORAL | Status: AC
  Filled 2013-01-15: qty 2

## 2013-01-15 MED ORDER — KETOROLAC TROMETHAMINE 30 MG/ML IJ SOLN
30.0000 mg | Freq: Once | INTRAMUSCULAR | Status: AC
  Administered 2013-01-15: 30 mg via INTRAVENOUS

## 2013-01-15 MED ORDER — BACITRACIN 50000 UNITS IM SOLR
INTRAMUSCULAR | Status: AC
  Filled 2013-01-15: qty 1

## 2013-01-15 MED ORDER — KETOROLAC TROMETHAMINE 30 MG/ML IJ SOLN
30.0000 mg | Freq: Four times a day (QID) | INTRAMUSCULAR | Status: DC
  Administered 2013-01-15 – 2013-01-16 (×2): 30 mg via INTRAVENOUS
  Filled 2013-01-15 (×6): qty 1

## 2013-01-15 MED ORDER — MAGNESIUM HYDROXIDE 400 MG/5ML PO SUSP: 30.0000 mL | Freq: Every day | ORAL | Status: DC | PRN

## 2013-01-15 MED ORDER — PROPOFOL 10 MG/ML IV BOLUS
INTRAVENOUS | Status: DC | PRN
  Administered 2013-01-15: 150 mg via INTRAVENOUS

## 2013-01-15 MED ORDER — HYDROMORPHONE HCL PF 1 MG/ML IJ SOLN
INTRAMUSCULAR | Status: AC
  Administered 2013-01-15: 1 mg
  Filled 2013-01-15: qty 1

## 2013-01-15 MED ORDER — ALUM & MAG HYDROXIDE-SIMETH 200-200-20 MG/5ML PO SUSP: 30.0000 mL | Freq: Four times a day (QID) | ORAL | Status: DC | PRN

## 2013-01-15 MED ORDER — PHENOL 1.4 % MT LIQD: 1.0000 | OROMUCOSAL | Status: DC | PRN

## 2013-01-15 MED ORDER — FENTANYL CITRATE 0.05 MG/ML IJ SOLN
INTRAMUSCULAR | Status: DC | PRN
  Administered 2013-01-15 (×5): 50 ug via INTRAVENOUS
  Administered 2013-01-15: 100 ug via INTRAVENOUS

## 2013-01-15 MED ORDER — ACETAMINOPHEN 325 MG PO TABS: 650.0000 mg | ORAL_TABLET | ORAL | Status: DC | PRN

## 2013-01-15 MED ORDER — BISACODYL 10 MG RE SUPP: 10.0000 mg | Freq: Every day | RECTAL | Status: DC | PRN

## 2013-01-15 MED ORDER — ACETAMINOPHEN 650 MG RE SUPP: 650.0000 mg | RECTAL | Status: DC | PRN

## 2013-01-15 MED ORDER — SODIUM CHLORIDE 0.9 % IV SOLN
INTRAVENOUS | Status: AC
  Filled 2013-01-15: qty 500

## 2013-01-15 MED ORDER — DIAZEPAM 5 MG/ML IJ SOLN
INTRAMUSCULAR | Status: AC
  Filled 2013-01-15: qty 2

## 2013-01-15 MED ORDER — 0.9 % SODIUM CHLORIDE (POUR BTL) OPTIME
TOPICAL | Status: DC | PRN
  Administered 2013-01-15: 1000 mL

## 2013-01-15 MED ORDER — THROMBIN 5000 UNITS EX SOLR
OROMUCOSAL | Status: DC | PRN
  Administered 2013-01-15: 15:00:00 via TOPICAL

## 2013-01-15 MED ORDER — MIDAZOLAM HCL 5 MG/5ML IJ SOLN
INTRAMUSCULAR | Status: DC | PRN
  Administered 2013-01-15: 2 mg via INTRAVENOUS

## 2013-01-15 MED ORDER — OXYCODONE HCL 5 MG PO TABS
5.0000 mg | ORAL_TABLET | ORAL | Status: DC | PRN
  Administered 2013-01-15 – 2013-01-16 (×2): 10 mg via ORAL
  Filled 2013-01-15: qty 2

## 2013-01-15 MED ORDER — DIAZEPAM 5 MG/ML IJ SOLN
2.5000 mg | Freq: Three times a day (TID) | INTRAMUSCULAR | Status: DC | PRN
  Administered 2013-01-15: 2.5 mg via INTRAVENOUS

## 2013-01-15 MED ORDER — ZOLPIDEM TARTRATE 5 MG PO TABS: 5.0000 mg | ORAL_TABLET | Freq: Every evening | ORAL | Status: DC | PRN

## 2013-01-15 MED ORDER — DEXAMETHASONE SODIUM PHOSPHATE 4 MG/ML IJ SOLN
INTRAMUSCULAR | Status: DC | PRN
  Administered 2013-01-15: 8 mg via INTRAVENOUS

## 2013-01-15 MED ORDER — THROMBIN 20000 UNITS EX SOLR
CUTANEOUS | Status: DC | PRN
  Administered 2013-01-15: 14:00:00 via TOPICAL

## 2013-01-15 MED ORDER — LIDOCAINE-EPINEPHRINE 1 %-1:100000 IJ SOLN
INTRAMUSCULAR | Status: DC | PRN
  Administered 2013-01-15 (×2): 5 mL

## 2013-01-15 MED ORDER — CYCLOBENZAPRINE HCL 10 MG PO TABS
10.0000 mg | ORAL_TABLET | Freq: Three times a day (TID) | ORAL | Status: DC | PRN
  Administered 2013-01-16: 10 mg via ORAL
  Filled 2013-01-15: qty 1

## 2013-01-15 MED ORDER — MORPHINE SULFATE 4 MG/ML IJ SOLN
4.0000 mg | INTRAMUSCULAR | Status: DC | PRN
  Filled 2013-01-15: qty 1

## 2013-01-15 MED ORDER — ONDANSETRON HCL 4 MG/2ML IJ SOLN
INTRAMUSCULAR | Status: DC | PRN
  Administered 2013-01-15: 4 mg via INTRAVENOUS

## 2013-01-15 MED ORDER — CYCLOBENZAPRINE HCL 10 MG PO TABS
ORAL_TABLET | ORAL | Status: AC
  Administered 2013-01-15: 10 mg
  Filled 2013-01-15: qty 1

## 2013-01-15 MED ORDER — SODIUM CHLORIDE 0.9 % IJ SOLN: 3.0000 mL | INTRAMUSCULAR | Status: DC | PRN

## 2013-01-15 MED ORDER — ROCURONIUM BROMIDE 100 MG/10ML IV SOLN
INTRAVENOUS | Status: DC | PRN
  Administered 2013-01-15: 10 mg via INTRAVENOUS
  Administered 2013-01-15: 50 mg via INTRAVENOUS
  Administered 2013-01-15: 10 mg via INTRAVENOUS
  Administered 2013-01-15: 20 mg via INTRAVENOUS

## 2013-01-15 MED ORDER — ACETAMINOPHEN 10 MG/ML IV SOLN
INTRAVENOUS | Status: AC
  Filled 2013-01-15: qty 100

## 2013-01-15 MED ORDER — ARTIFICIAL TEARS OP OINT
TOPICAL_OINTMENT | OPHTHALMIC | Status: DC | PRN
  Administered 2013-01-15: 1 via OPHTHALMIC

## 2013-01-15 MED ORDER — ACETAMINOPHEN 10 MG/ML IV SOLN
1000.0000 mg | Freq: Four times a day (QID) | INTRAVENOUS | Status: DC
  Administered 2013-01-15 – 2013-01-16 (×2): 1000 mg via INTRAVENOUS
  Filled 2013-01-15 (×4): qty 100

## 2013-01-15 MED ORDER — KETOROLAC TROMETHAMINE 30 MG/ML IJ SOLN
INTRAMUSCULAR | Status: AC
  Filled 2013-01-15: qty 1

## 2013-01-15 MED ORDER — SODIUM CHLORIDE 0.9 % IR SOLN
Status: DC | PRN
  Administered 2013-01-15: 14:00:00

## 2013-01-15 MED ORDER — FAMOTIDINE 20 MG PO TABS
20.0000 mg | ORAL_TABLET | Freq: Two times a day (BID) | ORAL | Status: DC
  Administered 2013-01-15: 20 mg via ORAL
  Filled 2013-01-15 (×3): qty 1

## 2013-01-15 MED ORDER — LACTATED RINGERS IV SOLN
INTRAVENOUS | Status: DC | PRN
  Administered 2013-01-15 (×2): via INTRAVENOUS

## 2013-01-15 MED ORDER — ALPRAZOLAM 1 MG PO TABS
1.0000 mg | ORAL_TABLET | Freq: Three times a day (TID) | ORAL | Status: DC | PRN
  Administered 2013-01-16: 1 mg via ORAL
  Filled 2013-01-15: qty 2

## 2013-01-15 MED ORDER — ACETAMINOPHEN 10 MG/ML IV SOLN
INTRAVENOUS | Status: DC | PRN
  Administered 2013-01-15: 1000 mg via INTRAVENOUS

## 2013-01-15 MED ORDER — LIDOCAINE HCL (CARDIAC) 20 MG/ML IV SOLN
INTRAVENOUS | Status: DC | PRN
  Administered 2013-01-15: 40 mg via INTRAVENOUS

## 2013-01-15 MED ORDER — POTASSIUM CHLORIDE IN NACL 20-0.9 MEQ/L-% IV SOLN
INTRAVENOUS | Status: DC
  Administered 2013-01-15: 20:00:00 via INTRAVENOUS
  Filled 2013-01-15 (×4): qty 1000

## 2013-01-15 SURGICAL SUPPLY — 79 items
18" TOURNIQUET ×1 IMPLANT
ADH SKN CLS APL DERMABOND .7 (GAUZE/BANDAGES/DRESSINGS)
ADH SKN CLS LQ APL DERMABOND (GAUZE/BANDAGES/DRESSINGS) ×2
ALLOGRAFT 7X14X11 (Bone Implant) ×2 IMPLANT
BAG DECANTER FOR FLEXI CONT (MISCELLANEOUS) ×3 IMPLANT
BANDAGE GAUZE 4  KLING STR (GAUZE/BANDAGES/DRESSINGS) ×3 IMPLANT
BANDAGE GAUZE ELAST BULKY 4 IN (GAUZE/BANDAGES/DRESSINGS) ×1 IMPLANT
BIT DRILL NEURO 2X3.1 SFT TUCH (MISCELLANEOUS) ×2 IMPLANT
BLADE SURG 15 STRL LF DISP TIS (BLADE) ×4 IMPLANT
BLADE SURG 15 STRL SS (BLADE) ×6
BLADE ULTRA TIP 2M (BLADE) ×3 IMPLANT
BOWL SPATULA (MISCELLANEOUS) ×1 IMPLANT
BRUSH SCRUB EZ PLAIN DRY (MISCELLANEOUS) ×4 IMPLANT
CANISTER SUCTION 2500CC (MISCELLANEOUS) ×3 IMPLANT
CATH ROBINSON RED A/P 14FR (CATHETERS) ×1 IMPLANT
CLOTH BEACON ORANGE TIMEOUT ST (SAFETY) ×3 IMPLANT
CONT SPEC 4OZ CLIKSEAL STRL BL (MISCELLANEOUS) ×3 IMPLANT
CORDS BIPOLAR (ELECTRODE) ×3 IMPLANT
COVER MAYO STAND STRL (DRAPES) ×3 IMPLANT
DECANTER SPIKE VIAL GLASS SM (MISCELLANEOUS) ×2 IMPLANT
DERMABOND ADHESIVE PROPEN (GAUZE/BANDAGES/DRESSINGS) ×1
DERMABOND ADVANCED (GAUZE/BANDAGES/DRESSINGS)
DERMABOND ADVANCED .7 DNX12 (GAUZE/BANDAGES/DRESSINGS) ×2 IMPLANT
DERMABOND ADVANCED .7 DNX6 (GAUZE/BANDAGES/DRESSINGS) IMPLANT
DRAPE EXTREMITY T 121X128X90 (DRAPE) ×3 IMPLANT
DRAPE LAPAROTOMY 100X72 PEDS (DRAPES) ×3 IMPLANT
DRAPE MICROSCOPE LEICA (MISCELLANEOUS) ×3 IMPLANT
DRAPE POUCH INSTRU U-SHP 10X18 (DRAPES) ×3 IMPLANT
DRAPE PROXIMA HALF (DRAPES) ×1 IMPLANT
DRILL NEURO 2X3.1 SOFT TOUCH (MISCELLANEOUS) ×3
DRSG EMULSION OIL 3X3 NADH (GAUZE/BANDAGES/DRESSINGS) ×3 IMPLANT
ELECT COATED BLADE 2.86 ST (ELECTRODE) ×3 IMPLANT
ELECT REM PT RETURN 9FT ADLT (ELECTROSURGICAL) ×3
ELECTRODE REM PT RTRN 9FT ADLT (ELECTROSURGICAL) ×2 IMPLANT
GAUZE SPONGE 4X4 16PLY XRAY LF (GAUZE/BANDAGES/DRESSINGS) ×3 IMPLANT
GLOVE BIOGEL PI IND STRL 7.0 (GLOVE) IMPLANT
GLOVE BIOGEL PI IND STRL 8 (GLOVE) ×2 IMPLANT
GLOVE BIOGEL PI INDICATOR 7.0 (GLOVE) ×2
GLOVE BIOGEL PI INDICATOR 8 (GLOVE) ×2
GLOVE ECLIPSE 7.5 STRL STRAW (GLOVE) ×4 IMPLANT
GLOVE EXAM NITRILE LRG STRL (GLOVE) ×3 IMPLANT
GLOVE EXAM NITRILE MD LF STRL (GLOVE) IMPLANT
GLOVE EXAM NITRILE XL STR (GLOVE) IMPLANT
GLOVE EXAM NITRILE XS STR PU (GLOVE) IMPLANT
GLOVE SURG SS PI 7.0 STRL IVOR (GLOVE) ×3 IMPLANT
GOWN BRE IMP SLV AUR LG STRL (GOWN DISPOSABLE) IMPLANT
GOWN BRE IMP SLV AUR XL STRL (GOWN DISPOSABLE) ×8 IMPLANT
GOWN STRL REIN 2XL LVL4 (GOWN DISPOSABLE) IMPLANT
HEAD HALTER (SOFTGOODS) ×3 IMPLANT
KIT BASIN OR (CUSTOM PROCEDURE TRAY) ×3 IMPLANT
KIT ROOM TURNOVER OR (KITS) ×3 IMPLANT
NDL HYPO 25X1 1.5 SAFETY (NEEDLE) ×2 IMPLANT
NDL SPNL 22GX3.5 QUINCKE BK (NEEDLE) ×4 IMPLANT
NEEDLE HYPO 25X1 1.5 SAFETY (NEEDLE) ×6 IMPLANT
NEEDLE SPNL 22GX3.5 QUINCKE BK (NEEDLE) ×6 IMPLANT
NS IRRIG 1000ML POUR BTL (IV SOLUTION) ×3 IMPLANT
PACK LAMINECTOMY NEURO (CUSTOM PROCEDURE TRAY) ×3 IMPLANT
PACK SURGICAL SETUP 50X90 (CUSTOM PROCEDURE TRAY) ×3 IMPLANT
PAD ARMBOARD 7.5X6 YLW CONV (MISCELLANEOUS) ×8 IMPLANT
PLATE CERVICAL 32MM (Plate) ×1 IMPLANT
RUBBERBAND STERILE (MISCELLANEOUS) ×6 IMPLANT
SCREW FIX 4.0X15MM (Screw) ×1 IMPLANT
SCREW VAR 4.0X13MM (Screw) ×2 IMPLANT
SCREW VAR 4.0X15MM (Screw) ×2 IMPLANT
SPONGE GAUZE 4X4 12PLY (GAUZE/BANDAGES/DRESSINGS) ×4 IMPLANT
SPONGE INTESTINAL PEANUT (DISPOSABLE) ×3 IMPLANT
SPONGE SURGIFOAM ABS GEL 100 (HEMOSTASIS) ×3 IMPLANT
STAPLER SKIN PROX WIDE 3.9 (STAPLE) ×1 IMPLANT
STOCKINETTE 4X48 STRL (DRAPES) ×3 IMPLANT
SUT ETHILON 4 0 PS 2 18 (SUTURE) ×3 IMPLANT
SUT VIC AB 2-0 CP2 18 (SUTURE) ×3 IMPLANT
SUT VIC AB 3-0 SH 8-18 (SUTURE) ×3 IMPLANT
SYR 20ML ECCENTRIC (SYRINGE) ×3 IMPLANT
TAPE SURG TRANSPORE 1 IN (GAUZE/BANDAGES/DRESSINGS) IMPLANT
TAPE SURGICAL TRANSPORE 1 IN (GAUZE/BANDAGES/DRESSINGS) ×1
TOWEL OR 17X24 6PK STRL BLUE (TOWEL DISPOSABLE) ×9 IMPLANT
TOWEL OR 17X26 10 PK STRL BLUE (TOWEL DISPOSABLE) ×3 IMPLANT
UNDERPAD 30X30 INCONTINENT (UNDERPADS AND DIAPERS) ×3 IMPLANT
WATER STERILE IRR 1000ML POUR (IV SOLUTION) ×3 IMPLANT

## 2013-01-15 NOTE — Op Note (Signed)
01/15/2013  3:35 PM  PATIENT:  [name]  52 y.o. female  PRE-OPERATIVE DIAGNOSIS:  1)  cervical disc disorder with myelopathy, cervical spondylosis, cervical radiculopathy 2) right carpal tunnel syndrome  POST-OPERATIVE DIAGNOSIS:  1)  cervical disc disorder with myelopathy, cervical spondylosis, cervical radiculopathy 2) right carpal tunnel syndrome  PROCEDURE:  Procedure(s): ANTERIOR CERVICAL DECOMPRESSION/DISCECTOMY FUSION 2 LEVELS: C5-6 and C6-7 anterior cervical decompression and arthrodesis with allograft and tether cervical plating RIGHT CARPAL TUNNEL RELEASE: Right carpal tunnel release  SURGEON:  Surgeon(s): Hewitt Shorts, MD  ASSISTANTS: Coletta Memos, MD  ANESTHESIA:   general  EBL:  Total I/O In: 1000 [I.V.:1000] Out: 25 [Blood:25]  BLOOD ADMINISTERED:none  COUNT: Correct per nursing staff  DICTATION: Patient was brought to the operating room, placed under general endotracheal anesthesia. The right upper extremity had a pneumatic tourniquet applied to the arm, the arm was exsanguinated with an elastic wrap, and the tourniquet inflated. The right upper extremity was prepped with Betadine soap and solution and draped in a sterile fashion. Incision made in the proximal right palm just medial to the thenar crease. Dissection was carried down to the transverse carpal ligament was carefully divided has full distal to proximal extent. The ligament was found to be markedly thickened and good decompression of the underlying median nerve was achieved.  We confirmed that we did openly when fully from proximal distal, and then proceeded with closure. The subcutaneous and subcuticular layers were approximate interrupted inverted 3-0 undyed Vicryl suture, the skin edges were approximated 4-0 nylon suture placed in interrupted horizontal mattress fashion. The was dressed with Adaptic and gauze fluffs, and wrapped with a 4 inch Kling. The tourniquet was released.  The patient was  then repositioned for the anterior cervical discectomy and fusion.  Patient was placed in 10 pounds of halter traction. The neck was prepped with Betadine soap and solution and draped in a sterile fashion. A horizontal incision was made on the left side of the neck. The line of the incision was infiltrated with local anesthetic with epinephrine. Dissection was carried down thru the subcutaneous tissue and platysma, bipolar cautery was used to maintain hemostasis. Dissection was then carried out thru an avascular plane leaving the sternocleidomastoid carotid artery and jugular vein laterally and the trachea and esophagus medially. The ventral aspect of the vertebral column was identified and a localizing x-ray was taken. The C5-6 and C6-7 levels were identified. The annulus at each level was incised and the disc space entered. Discectomy was performed with micro-curettes and pituitary rongeurs. The operating microscope was draped and brought into the field provided additional magnification illumination and visualization. Discectomy was continued posteriorly thru the disc space and then the cartilaginous endplate was removed using micro-curettes along with the high-speed drill. Posterior osteophytic overgrowth was removed each level using the high-speed drill along with a 2 mm thin footplated Kerrison punch. Posterior longitudinal ligament along with disc herniation was carefully removed, decompressing the spinal canal and thecal sac. We then continued to remove osteophytic overgrowth and disc material decompressing the neural foramina and exiting nerve roots bilaterally. Once the decompression was completed hemostasis was established at each level with the use of Gelfoam with thrombin and bipolar cautery. The Gelfoam was removed the wound irrigated and hemostasis confirmed. We then measured the height of the intravertebral disc space level and selected a 7 millimeter in height structural allograft for the C5-6 level  and a 7 millimeter in height structural allograft for the C6-7 level .  Each was hydrated and saline solution and then gently positioned in the intravertebral disc space and countersunk. We then selected a 32 millimeter in height Tether cervical plate. It was positioned over the fusion construct and secured to the vertebra with a pair of 4 x 13 mm variable screws at the C5 level, a single 4 x 15 mm fixed screw at the C6 level, and a pair of 4 x 15 mm variable screws at the C7 level. Each screw hole was started with the high-speed drill and then the screws placed, once all the screws were placed final tightening was performed. The wound was irrigated with bacitracin solution checked for hemostasis which was established and confirmed. An x-ray was taken which showed grafts in good position, plate and screws in good position, and the overall alignment to be good, although visualization was limited to the patient's shoulders. We then proceeded with closure. The platysma was closed with interrupted inverted 2-0 undyed Vicryl suture, the subcutaneous and subcuticular closed with interrupted inverted 3-0 undyed Vicryl suture. The skin edges were approximated with Dermabond. Following surgery the patient was taken out of cervical traction. To be reversed and the anesthetic and taken to the recovery room for further care.  PLAN OF CARE: Admit for overnight observation  PATIENT DISPOSITION:  PACU - hemodynamically stable.   Delay start of Pharmacological VTE agent (>24hrs) due to surgical blood loss or risk of bleeding:  yes

## 2013-01-15 NOTE — Plan of Care (Signed)
Problem: Consults Goal: Diagnosis - Spinal Surgery Outcome: Completed/Met Date Met:  01/15/13 Cervical Spine Fusion and Rt. Carpal Tunnel Release

## 2013-01-15 NOTE — Anesthesia Preprocedure Evaluation (Addendum)
Anesthesia Evaluation  Patient identified by MRN, date of birth, ID band Patient awake    Reviewed: Allergy & Precautions, H&P , NPO status , Patient's Chart, lab work & pertinent test results  Airway Mallampati: I      Dental  (+) Teeth Intact and Caps,    Pulmonary Current Smoker,  breath sounds clear to auscultation        Cardiovascular hypertension,     Neuro/Psych    GI/Hepatic GERD-  Medicated and Controlled,  Endo/Other  Hypothyroidism   Renal/GU      Musculoskeletal   Abdominal   Peds  Hematology   Anesthesia Other Findings   Reproductive/Obstetrics                          Anesthesia Physical Anesthesia Plan  ASA: II  Anesthesia Plan: General   Post-op Pain Management:    Induction: Intravenous  Airway Management Planned: Oral ETT  Additional Equipment:   Intra-op Plan:   Post-operative Plan: Extubation in OR  Informed Consent: I have reviewed the patients History and Physical, chart, labs and discussed the procedure including the risks, benefits and alternatives for the proposed anesthesia with the patient or authorized representative who has indicated his/her understanding and acceptance.   Dental advisory given  Plan Discussed with: CRNA and Anesthesiologist  Anesthesia Plan Comments:         Anesthesia Quick Evaluation

## 2013-01-15 NOTE — Anesthesia Procedure Notes (Signed)
Procedure Name: MAC Date/Time: 01/15/2013 12:43 PM Performed by: Quentin Ore Pre-anesthesia Checklist: Patient identified, Emergency Drugs available, Suction available, Patient being monitored and Timeout performed Patient Re-evaluated:Patient Re-evaluated prior to inductionOxygen Delivery Method: Circle system utilized Preoxygenation: Pre-oxygenation with 100% oxygen Intubation Type: IV induction Ventilation: Mask ventilation without difficulty and Oral airway inserted - appropriate to patient size Laryngoscope Size: Hyacinth Meeker and 2 Grade View: Grade I Tube type: Oral Tube size: 7.0 mm Number of attempts: 1 Airway Equipment and Method: Stylet and LTA kit utilized Placement Confirmation: ETT inserted through vocal cords under direct vision,  positive ETCO2,  CO2 detector and breath sounds checked- equal and bilateral Secured at: 22 cm Tube secured with: Tape Dental Injury: Teeth and Oropharynx as per pre-operative assessment  Comments: ATI by SRNA with MDA supervision.

## 2013-01-15 NOTE — OR Nursing (Signed)
Carpal Tunnel complete @ 1315, Anterior Cervical Decompression and Fusion incision @ 1345

## 2013-01-15 NOTE — Transfer of Care (Signed)
Immediate Anesthesia Transfer of Care Note  Patient: [name]  Procedure(s) Performed: Procedure(s) with comments: ANTERIOR CERVICAL DECOMPRESSION/DISCECTOMY FUSION 2 LEVELS (N/A) - Cervical five-six, cervical six-seven anterior cervical decompression with fusion plating and bonegraft CARPAL TUNNEL RELEASE (Right) - Right Carpal Tunnel Release  Patient Location: PACU  Anesthesia Type:General  Level of Consciousness: awake, alert  and oriented  Airway & Oxygen Therapy: Patient Spontanous Breathing and Patient connected to nasal cannula oxygen  Post-op Assessment: Report given to PACU RN and Post -op Vital signs reviewed and stable  Post vital signs: Reviewed and stable  Complications: No apparent anesthesia complications

## 2013-01-15 NOTE — H&P (Signed)
Subjective: Patient is a 52 y.o. female who is admitted for treatment of spondylitic disc herniation with spinal cord compression, C5-6 worse than C6-7. 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 Examination was notable for weakness and atrophy in the left hand intrinsic musculature, and weakness of the left grip. Patient is admitted now for a 2 level C5-6 and C6-7 anterior cervical decompression and arthrodesis with allograft and cervical plating, and a right carpal tunnel release. The plan is to bring the patient back to surgery in a month or so for left ulnar nerve decompression and left carpal tunnel release.   Patient Active Problem List   Diagnosis Date Noted  . CHEST PAIN, ATYPICAL 06/28/2010  . 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 TOBACCO ABUSE 04/30/2009  . UNSPECIFIED ESSENTIAL HYPERTENSION 10/18/2007  . FLUSHING 10/18/2007  . ABDOMINAL PAIN, UNSPECIFIED SITE 10/18/2007  . GOITER 07/13/2007  . ANXIETY 07/13/2007  . MIGRAINE HEADACHE 07/13/2007  . HAY FEVER 07/13/2007  . GERD 07/13/2007  . ALLERGY 07/13/2007   Past Medical History  Diagnosis Date  . GERD (gastroesophageal reflux disease)   . Anxiety   . Allergy   . Tobacco use disorder   . Abdominal pain, unspecified site   . Abdominal pain, unspecified site   . Flushing   . Unspecified essential hypertension   . Goiter, unspecified   . Migraine, unspecified, without mention of intractable migraine without mention of status migrainosus   . Allergic rhinitis due to pollen   . Unspecified personal history presenting hazards to health     Past Surgical History  Procedure Laterality Date  . Thyroidectomy      No prescriptions prior to admission   No Known Allergies  History  Substance Use Topics  . Smoking status: Former Smoker    Types: Cigarettes    Quit date: 07/25/2011  . Smokeless tobacco: Not on  file     Comment: Pt states she smokes anywhere from 3-5 cigarettes per day.   . Alcohol Use: Yes     Comment: seldom    Family History  Problem Relation Age of Onset  . Hypertension Mother   . Goiter Mother   . Diabetes Father   . Alcohol abuse Father   . Cancer Neg Hx     breast,colon     Review of Systems A comprehensive review of systems was negative.  Objective: Vital signs in last 24 hours:    EXAM: Patient is a well-developed well-nourished white female in no acute distress.  Lungs are clear to auscultation , the patient has symmetrical respiratory excursion. Heart has a regular rate and rhythm normal S1 and S2 no murmur.   Abdomen is soft nontender nondistended bowel sounds are present. Extremity examination shows no clubbing cyanosis or edema. Musculoskeletal examination shows no tenderness to palpation over the cervical spinous processes or paracervical musculature. A positive Tinel's at the left carpal tunnel and negative Tinel's at the right carpal tunnel. He has a positive Tinel's over the left condylar groove and a negative Tinel's over the right frontal groove. He has a positive Phalen's test bilaterally at about 15 seconds. Range of motion neck is full, but she has some discomfort with lateral flexion to left, more so than with lateral flexion to the right. Neurologic examination shows 5/5 strength in the deltoids, biceps, and triceps bilaterally as well as in the right intrinsics and grip; however the  left intrinsics are 4-4+, and the left grip is 4+. There is mild atrophy of the left interosseus muscles. Sensory examination shows diminished sensation to pinprick in the left hand, more so in the left fifth digit and medial aspect of the left fourth digit, but some throughout all the digits of the left hand. Reflexes are 1 the biceps, brachialis, triceps, and quadriceps bilaterally. The left yesterday was 1 in the right gastric is absent. Toes are downgoing bilaterally. She  has a normal gait and stance.  Data Review:CBC    Component Value Date/Time   WBC 10.9* 01/09/2013 1540   RBC 4.98 01/09/2013 1540   HGB 15.9* 01/09/2013 1540   HCT 45.3 01/09/2013 1540   PLT 375 01/09/2013 1540   MCV 91.0 01/09/2013 1540   MCH 31.9 01/09/2013 1540   MCHC 35.1 01/09/2013 1540   RDW 13.4 01/09/2013 1540   LYMPHSABS 4.8* 06/28/2010 1741   MONOABS 1.0 06/28/2010 1741   EOSABS 0.1 06/28/2010 1741   BASOSABS 0.0 06/28/2010 1741                          BMET    Component Value Date/Time   NA 133* 01/09/2013 1540   K 3.9 01/09/2013 1540   CL 99 01/09/2013 1540   CO2 24 01/09/2013 1540   GLUCOSE 115* 01/09/2013 1540   BUN 17 01/09/2013 1540   CREATININE 0.77 01/09/2013 1540   CALCIUM 9.4 01/09/2013 1540   CALCIUM 10.5 10/18/2007 1935   GFRNONAA >90 01/09/2013 1540   GFRAA >90 01/09/2013 1540     Assessment/Plan: Patient with significant spondylitic disc herniations with spinal cord compression, C5-6 worsened C6-7, as well as bilateral carpal tunnel syndrome, right worse than left, and left ulnar neuropathy. Patient is admitted now for a 2 level C5-6 and C6-7 ACDF and right carpal tunnel release, with a plan to have her return in a month or so for left carpal tunnel release and left ulnar nerve decompression.  I discussed the nature the patient's pathology and our recommendations for surgical intervention at length with the patient. We discussed risks the surgical risks infection, bleeding, possibly for transfusion, the risk of nerve root dysfunction with pain weakness numbness or paresthesias, the risk of spinal cord dysfunction with paralysis of all 4 limbs, the risk of nerve dysfunction, and the risks of esophageal and/or laryngeal dysfunction, the risk of failure of her cervical arthrodesis and possibly for further surgery, particularly in the light of her smoking, and anesthetic risks of myocardial infarction, stroke, pneumonia, and death, all of which are increased due to her smoking  history. Understanding all this she does was proceed with surgery and is admitted for such.   Hewitt Shorts, MD 01/15/2013 7:26 AM

## 2013-01-15 NOTE — Progress Notes (Signed)
Filed Vitals:   01/15/13 1636 01/15/13 1639 01/15/13 1700 01/15/13 1750  BP: 150/93   173/84  Pulse: 81 73 71 81  Temp:   97 F (36.1 C) 98.4 F (36.9 C)  TempSrc:      Resp: 21 17 15 18   SpO2: 96% 97% 96% 93%    Patient with moderate discomfort in the base of her neck and upper back. Cervical incision clean and dry. Right carpal tunnel dressing clean and dry. Moving all 4 extremities well. Has not yet voided, but in and out catheterized at the end of surgery. We'll continue to monitor voiding function.  Plan: Encouraged to ambulate in the halls.  Hewitt Shorts, MD 01/15/2013, 6:43 PM

## 2013-01-15 NOTE — Preoperative (Signed)
Beta Blockers   Reason not to administer Beta Blockers:Not Applicable 

## 2013-01-15 NOTE — Anesthesia Postprocedure Evaluation (Signed)
  Anesthesia Post-op Note  Patient: [name]  Procedure(s) Performed: Procedure(s) with comments: ANTERIOR CERVICAL DECOMPRESSION/DISCECTOMY FUSION 2 LEVELS (N/A) - Cervical five-six, cervical six-seven anterior cervical decompression with fusion plating and bonegraft CARPAL TUNNEL RELEASE (Right) - Right Carpal Tunnel Release  Patient Location: PACU  Anesthesia Type:General  Level of Consciousness: awake and sedated  Airway and Oxygen Therapy: Patient Spontanous Breathing  Post-op Pain: mild  Post-op Assessment: Post-op Vital signs reviewed  Post-op Vital Signs: stable  Complications: No apparent anesthesia complications

## 2013-01-16 MED ORDER — CYCLOBENZAPRINE HCL 10 MG PO TABS: 5.0000 mg | ORAL_TABLET | Freq: Three times a day (TID) | ORAL | Status: DC | PRN

## 2013-01-16 MED ORDER — HYDROCODONE-ACETAMINOPHEN 5-325 MG PO TABS: 1.0000 | ORAL_TABLET | ORAL | Status: DC | PRN

## 2013-01-16 NOTE — Progress Notes (Signed)
Pt and husband given D/C instructions with Rx's, verbal understanding given. Pt received arm sling per order prior to D/C. Pt D/C'd home via wheelchair @ 1145 per MD order. Rema Fendt, RN

## 2013-01-16 NOTE — Discharge Summary (Signed)
Physician Discharge Summary  Patient ID: OAKLEY ORBAN MRN: [id number] DOB/AGE: 1961-07-12 52 y.o.  Admit date: 01/15/2013 Discharge date: 01/16/2013  Admission Diagnoses:  1) cervical disc disorder with myelopathy, cervical spondylosis, cervical radiculopathy 2) right carpal tunnel syndrome  Discharge Diagnoses:  1) cervical disc disorder with myelopathy, cervical spondylosis, cervical radiculopathy 2) right carpal tunnel syndrome  Discharged Condition: good  Hospital Course: Patient was admitted, underwent a right carpal tunnel release in the 2 level C5-6 and C6-7 ACDF. Initially following surgery the patient had moderate discomfort to clean the base of her neck and upper back, but is much more comfortable today. She has good mobility in the digits of her right hand. We went ahead and change the dressing in her right hand, instruct the patient and her husband and continue dressing changes for at least the next 5 days. They were given supplies. She is up and ambulating actively in the halls her cervical and her right hand incisions are both healing well, and clean and dry. She's been given instructions regarding wound care and activities following discharge. She is to return for suture removal from her right hand in 2 weeks, and to have a lateral cervical spine x-ray done.  Discharge Exam: Blood pressure 128/81, pulse 73, temperature 98.3 F (36.8 C), temperature source Oral, resp. rate 16, SpO2 92.00%.  Disposition: Home     Medication List    TAKE these medications       ALPRAZolam 1 MG tablet  Commonly known as:  XANAX  Take 1 mg by mouth 3 (three) times daily as needed for sleep or anxiety.     aspirin 81 MG EC tablet  Take 81 mg by mouth daily.     cetirizine 10 MG tablet  Commonly known as:  ZYRTEC  Take 1 tablet (10 mg total) by mouth daily.     cyclobenzaprine 10 MG tablet  Commonly known as:  FLEXERIL  Take 0.5-1 tablets (5-10 mg total) by mouth 3 (three) times daily  as needed for muscle spasms.     famotidine 20 MG tablet  Commonly known as:  PEPCID  Take 20 mg by mouth 2 (two) times daily.     HYDROcodone-acetaminophen 5-325 MG per tablet  Commonly known as:  NORCO/VICODIN  Take 1-2 tablets by mouth every 4 (four) hours as needed for pain.     ibuprofen 200 MG tablet  Commonly known as:  ADVIL,MOTRIN  Take 600 mg by mouth every 6 (six) hours as needed for pain.     levothyroxine 125 MCG tablet  Commonly known as:  SYNTHROID, LEVOTHROID  Take 125 mcg by mouth daily before breakfast.         Signed: Hewitt Shorts, MD 01/16/2013, 10:56 AM

## 2013-01-20 ENCOUNTER — Encounter (HOSPITAL_COMMUNITY): Payer: Self-pay | Admitting: Neurosurgery

## 2013-01-29 ENCOUNTER — Other Ambulatory Visit: Payer: Self-pay

## 2013-01-29 ENCOUNTER — Other Ambulatory Visit: Payer: Self-pay | Admitting: Internal Medicine

## 2013-01-29 MED ORDER — CETIRIZINE HCL 10 MG PO TABS: 10.0000 mg | ORAL_TABLET | Freq: Every day | ORAL | Status: DC

## 2013-02-17 ENCOUNTER — Other Ambulatory Visit: Payer: Self-pay | Admitting: Neurosurgery

## 2013-02-19 ENCOUNTER — Encounter (HOSPITAL_COMMUNITY): Payer: Self-pay | Admitting: Pharmacy Technician

## 2013-02-21 ENCOUNTER — Emergency Department (HOSPITAL_COMMUNITY)
Admission: EM | Admit: 2013-02-21 | Discharge: 2013-02-21 | Disposition: A | Discharge status: Home / Self Care | Payer: PRIVATE HEALTH INSURANCE | Attending: Emergency Medicine | Admitting: Emergency Medicine

## 2013-02-21 ENCOUNTER — Encounter (INDEPENDENT_AMBULATORY_CARE_PROVIDER_SITE_OTHER): Payer: PRIVATE HEALTH INSURANCE | Admitting: *Deleted

## 2013-02-21 ENCOUNTER — Encounter (HOSPITAL_COMMUNITY): Payer: Self-pay | Admitting: Emergency Medicine

## 2013-02-21 ENCOUNTER — Emergency Department (HOSPITAL_COMMUNITY): Payer: PRIVATE HEALTH INSURANCE

## 2013-02-21 ENCOUNTER — Encounter: Payer: Self-pay | Admitting: Neurological Surgery

## 2013-02-21 DIAGNOSIS — I82409 Acute embolism and thrombosis of unspecified deep veins of unspecified lower extremity: Secondary | ICD-10-CM | POA: Insufficient documentation

## 2013-02-21 DIAGNOSIS — Z79899 Other long term (current) drug therapy: Secondary | ICD-10-CM | POA: Insufficient documentation

## 2013-02-21 DIAGNOSIS — Z7982 Long term (current) use of aspirin: Secondary | ICD-10-CM | POA: Insufficient documentation

## 2013-02-21 DIAGNOSIS — M7989 Other specified soft tissue disorders: Secondary | ICD-10-CM

## 2013-02-21 DIAGNOSIS — E049 Nontoxic goiter, unspecified: Secondary | ICD-10-CM | POA: Insufficient documentation

## 2013-02-21 DIAGNOSIS — F411 Generalized anxiety disorder: Secondary | ICD-10-CM | POA: Insufficient documentation

## 2013-02-21 DIAGNOSIS — Z8709 Personal history of other diseases of the respiratory system: Secondary | ICD-10-CM | POA: Insufficient documentation

## 2013-02-21 DIAGNOSIS — I82402 Acute embolism and thrombosis of unspecified deep veins of left lower extremity: Secondary | ICD-10-CM

## 2013-02-21 DIAGNOSIS — K219 Gastro-esophageal reflux disease without esophagitis: Secondary | ICD-10-CM | POA: Insufficient documentation

## 2013-02-21 DIAGNOSIS — I10 Essential (primary) hypertension: Secondary | ICD-10-CM | POA: Insufficient documentation

## 2013-02-21 DIAGNOSIS — Z8679 Personal history of other diseases of the circulatory system: Secondary | ICD-10-CM | POA: Insufficient documentation

## 2013-02-21 DIAGNOSIS — Z87891 Personal history of nicotine dependence: Secondary | ICD-10-CM | POA: Insufficient documentation

## 2013-02-21 HISTORY — DX: Acute embolism and thrombosis of unspecified deep veins of unspecified lower extremity: I82.409

## 2013-02-21 LAB — POCT I-STAT, CHEM 8
Creatinine, Ser: 0.8 mg/dL (ref 0.50–1.10)
Glucose, Bld: 99 mg/dL (ref 70–99)
Hemoglobin: 14.3 g/dL (ref 12.0–15.0)
Sodium: 140 mEq/L (ref 135–145)
TCO2: 24 mmol/L (ref 0–100)

## 2013-02-21 MED ORDER — RIVAROXABAN 15 MG PO TABS
15.0000 mg | ORAL_TABLET | Freq: Two times a day (BID) | ORAL | Status: DC
  Administered 2013-02-21 (×2): 15 mg via ORAL
  Filled 2013-02-21 (×2): qty 1

## 2013-02-21 MED ORDER — IOHEXOL 350 MG/ML SOLN
100.0000 mL | Freq: Once | INTRAVENOUS | Status: AC | PRN
  Administered 2013-02-21: 100 mL via INTRAVENOUS

## 2013-02-21 MED ORDER — RIVAROXABAN 15 MG PO TABS: 15.0000 mg | ORAL_TABLET | Freq: Two times a day (BID) | ORAL | Status: DC

## 2013-02-21 MED ORDER — RIVAROXABAN 20 MG PO TABS: 20.0000 mg | ORAL_TABLET | Freq: Every day | ORAL | Status: DC

## 2013-02-21 NOTE — Progress Notes (Addendum)
ANTICOAGULATION CONSULT NOTE - Initial Consult  Pharmacy Consult for xarelto Indication: DVT  No Known Allergies  Patient Measurements:     Vital Signs: Temp: 97.9 F (36.6 C) (08/01 1126) Temp src: Oral (08/01 1126) BP: 138/92 mmHg (08/01 1259) Pulse Rate: 67 (08/01 1259)  Labs:  Recent Labs  02/21/13 1213  HGB 14.3  HCT 42.0  CREATININE 0.80    The CrCl is unknown because both a height and weight (above a minimum accepted value) are required for this calculation.   Medical History: Past Medical History  Diagnosis Date  . GERD (gastroesophageal reflux disease)   . Anxiety   . Allergy   . Tobacco use disorder   . Abdominal pain, unspecified site   . Abdominal pain, unspecified site   . Flushing   . Unspecified essential hypertension   . Goiter, unspecified   . Migraine, unspecified, without mention of intractable migraine without mention of status migrainosus   . Allergic rhinitis due to pollen   . Unspecified personal history presenting hazards to health       Assessment: [name] is a 52 yo F with a new L leg DVT.  Her CT is negative for PE.  She recently had neck surgery and R carpal tunnel release by Dr. Newell Coral on 01/15/13.  She plans on having L carpal tunnel release by Dr. Newell Coral in August. Wt 96 kg. Creat OK.   Plan:  1. Xarelto 15 mg po BID with food x 21 days 2. Then xarelto 20 mg q supper x 6 months 3. She will d/w Dr. Newell Coral how to hold xarelto for her upcoming wrist surgery 4. Pt and husband counseled extensively about xarelto and all questions answered and written data sheet given to pt. Herby Abraham, Pharm.D. [phone] 02/21/2013 1:44 PM

## 2013-02-21 NOTE — ED Notes (Addendum)
Sent from vascular center this am. Blood clot present in left leg per vascular center. Ambulatory from triage. Recent surgery. Pain in left leg at this time CP present "all the way across". PT states this has been present prior to surgery. SOB present, Radford Pax MD at bedside

## 2013-02-21 NOTE — ED Notes (Signed)
Family at bedside. 

## 2013-02-21 NOTE — ED Notes (Signed)
Patient transported to CT 

## 2013-02-21 NOTE — ED Provider Notes (Signed)
CSN: [id number]     Arrival date & time 02/21/13  1120 History     First MD Initiated Contact with Patient 02/21/13 1131     Chief Complaint  Patient presents with  . Leg Pain    HPI Sent from vascular center this am. Blood clot present in left leg per vascular center. Ambulatory from triage. Recent surgery. Pain in left leg at this time  CP present "all the way across". PT states this has been present prior to surgery. SOB present,  Past Medical History  Diagnosis Date  . GERD (gastroesophageal reflux disease)   . Anxiety   . Allergy   . Tobacco use disorder   . Abdominal pain, unspecified site   . Abdominal pain, unspecified site   . Flushing   . Unspecified essential hypertension   . Goiter, unspecified   . Migraine, unspecified, without mention of intractable migraine without mention of status migrainosus   . Allergic rhinitis due to pollen   . Unspecified personal history presenting hazards to health    Past Surgical History  Procedure Laterality Date  . Thyroidectomy    . Anterior cervical decomp/discectomy fusion N/A 01/15/2013    Procedure: ANTERIOR CERVICAL DECOMPRESSION/DISCECTOMY FUSION 2 LEVELS;  Surgeon: Hewitt Shorts, MD;  Location: [facility];  Service: Neurosurgery;  Laterality: N/A;  Cervical five-six, cervical six-seven anterior cervical decompression with fusion plating and bonegraft  . Carpal tunnel release Right 01/15/2013    Procedure: CARPAL TUNNEL RELEASE;  Surgeon: Hewitt Shorts, MD;  Location: [facility];  Service: Neurosurgery;  Laterality: Right;  Right Carpal Tunnel Release   Family History  Problem Relation Age of Onset  . Hypertension Mother   . Goiter Mother   . Diabetes Father   . Alcohol abuse Father   . Cancer Neg Hx     breast,colon   History  Substance Use Topics  . Smoking status: Former Smoker    Types: Cigarettes    Quit date: 07/25/2011  . Smokeless tobacco: Not on file     Comment: Pt states she smokes anywhere  from 3-5 cigarettes per day.   . Alcohol Use: Yes     Comment: seldom   OB History   Grav Para Term Preterm Abortions TAB SAB Ect Mult Living                 Review of Systems All other systems reviewed and are negative Allergies  Review of patient's allergies indicates no known allergies.  Home Medications   Current Outpatient Rx  Name  Route  Sig  Dispense  Refill  . ALPRAZolam (XANAX) 1 MG tablet   Oral   Take 1 mg by mouth 3 (three) times daily as needed for sleep or anxiety.         Marland Kitchen aspirin 81 MG EC tablet   Oral   Take 81 mg by mouth daily.           . cetirizine (ZYRTEC) 10 MG tablet   Oral   Take 10 mg by mouth daily.         . famotidine (PEPCID) 20 MG tablet   Oral   Take 20 mg by mouth 2 (two) times daily.         Marland Kitchen HYDROcodone-acetaminophen (NORCO/VICODIN) 5-325 MG per tablet   Oral   Take 1 tablet by mouth every 6 (six) hours as needed for pain.         Marland Kitchen ibuprofen (ADVIL,MOTRIN) 200 MG  tablet   Oral   Take 800 mg by mouth daily as needed for pain.         Marland Kitchen levothyroxine (SYNTHROID, LEVOTHROID) 125 MCG tablet   Oral   Take 125 mcg by mouth daily before breakfast.         . OVER THE COUNTER MEDICATION   Oral   Take 1 tablet by mouth daily. *otc med called Estra tone*         . Rivaroxaban (XARELTO) 15 MG TABS tablet   Oral   Take 1 tablet (15 mg total) by mouth 2 (two) times daily with a meal.   42 tablet   0    BP 138/92  Pulse 67  Temp(Src) 97.9 F (36.6 C) (Oral)  Resp 24  SpO2 99% Physical Exam  Nursing note and vitals reviewed. Constitutional: She is oriented to person, place, and time. She appears well-developed and well-nourished. No distress.  HENT:  Head: Normocephalic and atraumatic.  Eyes: Pupils are equal, round, and reactive to light.  Neck: Normal range of motion.  Cardiovascular: Normal rate and intact distal pulses.   Pulmonary/Chest: No respiratory distress.  Abdominal: Normal appearance. She  exhibits no distension.  Musculoskeletal: Normal range of motion.       Left upper leg: She exhibits swelling.       Left lower leg: She exhibits swelling.  Neurological: She is alert and oriented to person, place, and time. No cranial nerve deficit.  Skin: Skin is warm and dry. No rash noted.  Psychiatric: She has a normal mood and affect. Her behavior is normal.    ED Course   Procedures (including critical care time)  Pharmacy consult obtained.  Labs Reviewed  POCT I-STAT, CHEM 8 - Abnormal; Notable for the following:    Calcium, Ion 1.24 (*)    All other components within normal limits   Ct Angio Chest Pe W/cm &/or Wo Cm  02/21/2013   *RADIOLOGY REPORT*  Clinical Data: Lower extremity DVT.  Pain.  CT ANGIOGRAPHY CHEST  Technique:  Multidetector CT imaging of the chest using the standard protocol during bolus administration of intravenous contrast. Multiplanar reconstructed images including MIPs were obtained and reviewed to evaluate the vascular anatomy.  Contrast: OMNIPAQUE IOHEXOL 350 MG/ML SOLN  Comparison: None.  Findings: No filling defects in the pulmonary arteries to suggest pulmonary emboli. Heart is normal size. Aorta is normal caliber. Lungs are clear.  No focal airspace opacities or suspicious nodules.  No effusions.  Scattered coronary artery calcifications in the right coronary artery.  A small mediastinal lymph nodes in the prevascular space and AP window, not pathologically enlarged. Largest prevascular node has a short axis diameter 8 mm.  Prior thyroidectomy.  Chest wall soft tissues are unremarkable. Imaging into the upper abdomen shows no acute findings.  On no acute bony abnormality.  Degenerative spurring throughout the thoracic spine.  IMPRESSION: No evidence of pulmonary embolus.  No acute findings.   Original Report Authenticated By: Charlett Nose, M.D.   1. DVT (deep venous thrombosis), left     MDM  We'll start on outpatient anticoagulant therapy with  followup.  Will need increased dosage after 21 days.  We'll also need to discuss with her surgeon about her upcoming carpal tunnel release.  Nelia Shi, MD 02/21/13 1346

## 2013-02-21 NOTE — ED Notes (Addendum)
Called [facility] vein specialist of [place] on [address] to obtain report. Spoke to [place] who  will attempt to fax report. Scarlette Calico states that she is  unable to see test results but can confirm that pt received study this morning. ED MD made aware.

## 2013-02-27 ENCOUNTER — Encounter: Payer: Self-pay | Admitting: Internal Medicine

## 2013-02-27 ENCOUNTER — Ambulatory Visit (INDEPENDENT_AMBULATORY_CARE_PROVIDER_SITE_OTHER): Payer: PRIVATE HEALTH INSURANCE | Admitting: Internal Medicine

## 2013-02-27 ENCOUNTER — Inpatient Hospital Stay (HOSPITAL_COMMUNITY): Admission: RE | Admit: 2013-02-27 | Payer: PRIVATE HEALTH INSURANCE | Source: Ambulatory Visit

## 2013-02-27 VITALS — BP 136/86 | HR 71 | Temp 97.5°F | Wt 218.0 lb

## 2013-02-27 DIAGNOSIS — F411 Generalized anxiety disorder: Secondary | ICD-10-CM

## 2013-02-27 DIAGNOSIS — E890 Postprocedural hypothyroidism: Secondary | ICD-10-CM

## 2013-02-27 DIAGNOSIS — I10 Essential (primary) hypertension: Secondary | ICD-10-CM

## 2013-02-27 DIAGNOSIS — E669 Obesity, unspecified: Secondary | ICD-10-CM

## 2013-02-27 MED ORDER — ALPRAZOLAM ER 1 MG PO TB24: 1.0000 mg | ORAL_TABLET | ORAL | Status: DC

## 2013-02-27 NOTE — Progress Notes (Signed)
Subjective:    Patient ID: [name], female    DOB: Jul 08, 1961, 52 y.o.   MRN: [id number]  HPI [name] presents for follow up after Cervical disc surgery and carpal tunnel repair right. She is having a lot pain and discomfort, weight gain from lack of calorie burn and emotional frustration with irritability, emotionality. She was found to have a left calve DVT and is on Xeralto.   Past Medical History  Diagnosis Date  . GERD (gastroesophageal reflux disease)   . Anxiety   . Allergy   . Tobacco use disorder   . Abdominal pain, unspecified site   . Abdominal pain, unspecified site   . Flushing   . Unspecified essential hypertension   . Goiter, unspecified   . Migraine, unspecified, without mention of intractable migraine without mention of status migrainosus   . Allergic rhinitis due to pollen   . Unspecified personal history presenting hazards to health    Past Surgical History  Procedure Laterality Date  . Thyroidectomy    . Anterior cervical decomp/discectomy fusion N/A 01/15/2013    Procedure: ANTERIOR CERVICAL DECOMPRESSION/DISCECTOMY FUSION 2 LEVELS;  Surgeon: Hewitt Shorts, MD;  Location: [facility];  Service: Neurosurgery;  Laterality: N/A;  Cervical five-six, cervical six-seven anterior cervical decompression with fusion plating and bonegraft  . Carpal tunnel release Right 01/15/2013    Procedure: CARPAL TUNNEL RELEASE;  Surgeon: Hewitt Shorts, MD;  Location: [facility];  Service: Neurosurgery;  Laterality: Right;  Right Carpal Tunnel Release   Family History  Problem Relation Age of Onset  . Hypertension Mother   . Goiter Mother   . Diabetes Father   . Alcohol abuse Father   . Cancer Neg Hx     breast,colon   History   Social History  . Marital Status: Married    Spouse Name: N/A    Number of Children: N/A  . Years of Education: N/A   Occupational History  . Not on file.   Social History Main Topics  . Smoking status: Former Smoker    Types:  Cigarettes    Quit date: 07/25/2011  . Smokeless tobacco: Not on file     Comment: Pt states she smokes anywhere from 3-5 cigarettes per day.   . Alcohol Use: Yes     Comment: seldom  . Drug Use: No  . Sexually Active: Yes    Birth Control/ Protection: IUD   Other Topics Concern  . 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 ALPRAZolam (XANAX) 1 MG tablet Take 1 mg by mouth 3 (three) times daily as needed for sleep or anxiety.      Marland Kitchen aspirin 81 MG EC tablet Take 81 mg by mouth daily.        . cetirizine (ZYRTEC) 10 MG tablet Take 10 mg by mouth daily.      . famotidine (PEPCID) 20 MG tablet Take 20 mg by mouth 2 (two) times daily.      Marland Kitchen levothyroxine (SYNTHROID, LEVOTHROID) 125 MCG tablet Take 125 mcg by mouth daily before breakfast.      . OVER THE COUNTER MEDICATION Take 1 tablet by mouth daily. *otc med called Estra tone*      . Rivaroxaban (XARELTO) 15 MG TABS tablet Take 1 tablet (  15 mg total) by mouth 2 (two) times daily with a meal.  42 tablet  0   No current facility-administered medications on file prior to visit.      Review of Systems System review is negative for any constitutional, cardiac, pulmonary, GI or neuro symptoms or complaints other than as described in the HPI.     Objective:   Physical Exam Filed Vitals:   02/27/13 0926  BP: 136/86  Pulse: 71  Temp: 97.5 F (36.4 C)   O2 sat 97%  Cor - 2+ radial right, 1+ radial left Pulm - CTAP Ext - left leg soft and non-tender.       Assessment & Plan:

## 2013-02-27 NOTE — Patient Instructions (Addendum)
1. DVT - in the distal lower leg - you will need to be on Xeralto for three months. You will need to use support hose. Stop at every rest stop while traveling. You can resume full activity.  2. Weight management: Diet management: smart food choices, PORTION SIZE CONTROL, regular exercise. Goal - to loose 1-2 lbs.month. Target weight - 180 lbs.  3. Situational depression - irritable, emotional, frustrated, unhappy. Plan   short term use of Xanax XR 1 mg twice a day - plan on using this for 6-8 weeks  After things are better will switch back to regular Xanax and taper you off.   4. We cannot escape those who are less conscientious then we are...Marland KitchenMarland KitchenMarland Kitchenwith time will come wisdom and patience. It is getting there which is the TRICK.

## 2013-02-28 DIAGNOSIS — E66811 Obesity, class 1: Secondary | ICD-10-CM | POA: Insufficient documentation

## 2013-02-28 DIAGNOSIS — E669 Obesity, unspecified: Secondary | ICD-10-CM | POA: Insufficient documentation

## 2013-02-28 NOTE — Assessment & Plan Note (Signed)
Lab Results  Component Value Date   TSH 0.571 06/28/2010   Will be due for lab - TSH,FT4 at last visit.

## 2013-02-28 NOTE — Assessment & Plan Note (Signed)
Situational related increase in anxiety with depression: irritability, emotional lability, perseveration, poor sleep, weight gain. Discussed [place] of long acting medications including differentiating between habituation and addiction.  Plan Xanax XR 1 mg BID for 6-8 weeks

## 2013-02-28 NOTE — Assessment & Plan Note (Signed)
BP Readings from Last 3 Encounters:  02/27/13 136/86  02/21/13 138/92  01/16/13 128/81   Good control.

## 2013-02-28 NOTE — Assessment & Plan Note (Signed)
Some recent weight gain since surgery.  Plan Diet management: smart food choices, PORTION SIZE CONTROL, regular exercise. Goal - to loose 1-2 lbs.month. Target weight - 175 lbs  Encouraged water based exercise as easiest on neck and arms.

## 2013-03-03 ENCOUNTER — Ambulatory Visit (HOSPITAL_COMMUNITY): Admission: RE | Admit: 2013-03-03 | Payer: PRIVATE HEALTH INSURANCE | Source: Ambulatory Visit | Admitting: Neurosurgery

## 2013-03-03 ENCOUNTER — Encounter (HOSPITAL_COMMUNITY): Admission: RE | Payer: Self-pay | Source: Ambulatory Visit

## 2013-03-03 SURGERY — CARPAL TUNNEL RELEASE
Anesthesia: General | Laterality: Left

## 2013-03-10 ENCOUNTER — Telehealth: Payer: Self-pay | Admitting: *Deleted

## 2013-03-10 ENCOUNTER — Emergency Department (HOSPITAL_COMMUNITY)
Admission: EM | Admit: 2013-03-10 | Discharge: 2013-03-10 | Disposition: A | Discharge status: Home / Self Care | Payer: PRIVATE HEALTH INSURANCE | Attending: Emergency Medicine | Admitting: Emergency Medicine

## 2013-03-10 ENCOUNTER — Encounter (HOSPITAL_COMMUNITY): Payer: Self-pay | Admitting: *Deleted

## 2013-03-10 ENCOUNTER — Telehealth: Payer: Self-pay | Admitting: Internal Medicine

## 2013-03-10 DIAGNOSIS — Z7982 Long term (current) use of aspirin: Secondary | ICD-10-CM | POA: Insufficient documentation

## 2013-03-10 DIAGNOSIS — Z79899 Other long term (current) drug therapy: Secondary | ICD-10-CM | POA: Insufficient documentation

## 2013-03-10 DIAGNOSIS — M79609 Pain in unspecified limb: Secondary | ICD-10-CM | POA: Insufficient documentation

## 2013-03-10 DIAGNOSIS — Z8719 Personal history of other diseases of the digestive system: Secondary | ICD-10-CM | POA: Insufficient documentation

## 2013-03-10 DIAGNOSIS — Z8639 Personal history of other endocrine, nutritional and metabolic disease: Secondary | ICD-10-CM | POA: Insufficient documentation

## 2013-03-10 DIAGNOSIS — Z8679 Personal history of other diseases of the circulatory system: Secondary | ICD-10-CM | POA: Insufficient documentation

## 2013-03-10 DIAGNOSIS — I10 Essential (primary) hypertension: Secondary | ICD-10-CM | POA: Insufficient documentation

## 2013-03-10 DIAGNOSIS — Z8709 Personal history of other diseases of the respiratory system: Secondary | ICD-10-CM | POA: Insufficient documentation

## 2013-03-10 DIAGNOSIS — M79662 Pain in left lower leg: Secondary | ICD-10-CM

## 2013-03-10 DIAGNOSIS — F411 Generalized anxiety disorder: Secondary | ICD-10-CM | POA: Insufficient documentation

## 2013-03-10 DIAGNOSIS — Z7901 Long term (current) use of anticoagulants: Secondary | ICD-10-CM | POA: Insufficient documentation

## 2013-03-10 DIAGNOSIS — Z862 Personal history of diseases of the blood and blood-forming organs and certain disorders involving the immune mechanism: Secondary | ICD-10-CM | POA: Insufficient documentation

## 2013-03-10 DIAGNOSIS — Z86718 Personal history of other venous thrombosis and embolism: Secondary | ICD-10-CM | POA: Insufficient documentation

## 2013-03-10 DIAGNOSIS — Z9189 Other specified personal risk factors, not elsewhere classified: Secondary | ICD-10-CM | POA: Insufficient documentation

## 2013-03-10 DIAGNOSIS — Z87891 Personal history of nicotine dependence: Secondary | ICD-10-CM | POA: Insufficient documentation

## 2013-03-10 HISTORY — DX: Acute embolism and thrombosis of unspecified deep veins of unspecified lower extremity: I82.409

## 2013-03-10 NOTE — Telephone Encounter (Signed)
Patient Information:  Caller Name: [name]  Phone: [phone]  Patient: [name]  Gender: Female  DOB: [birth year]  Age: 52 Years  PCP: Illene Regulus (Adults only)  Pregnant: No  Office Follow Up:  Does the office need to follow up with this patient?: No  Instructions For The Office: N/A  RN Note:  Patient calling emergent line regarding pain in the back of the left knee.  Diagnosed with blood clot in left calf 10 days ago, and is taking xarelto.  States new pain developed behind left knee 03/10/13 and this pain is severe.  Per leg pain protocol, ED disposition; patient works across the street from [place] and can be seen there now.  Advised ED now.  krs/can  Symptoms  Reason For Call & Symptoms: diagnosed with blood clot in left calf 10 days ago, but now has severe pain behind left knee  Reviewed Health History In EMR: Yes  Reviewed Medications In EMR: Yes  Reviewed Allergies In EMR: Yes  Reviewed Surgeries / Procedures: Yes  Date of Onset of Symptoms: 03/10/2013 OB / GYN:  LMP: Unknown  Guideline(s) Used:  Leg Pain  Disposition Per Guideline:   Go to ED Now (or to Office with PCP Approval)  Reason For Disposition Reached:   History of prior "blood clot" in leg or lungs (i.e., deep vein thrombosis, pulmonary embolism)  Advice Given:  N/A  Patient Will Follow Care Advice:  YES

## 2013-03-10 NOTE — ED Provider Notes (Signed)
Medical screening examination/treatment/procedure(s) were performed by non-physician practitioner and as supervising physician I was immediately available for consultation/collaboration.   Gwyneth Sprout, MD 03/10/13 [phone]

## 2013-03-10 NOTE — Telephone Encounter (Signed)
[facility] Triage Call Report Triage Record Num: [id number] Operator: Donna Bernard Patient Name: Floyce Bujak Call Date & Time: 03/09/2013 1:45:26PM Patient Phone: [phone] PCP: Illene Regulus Patient Gender: Female PCP Fax : [phone] Patient DOB: 1961/05/28 Practice Name: [facility] Reason for Call: Caller: [place]/Patient; PCP: Illene Regulus (Adults only); CB#: [phone]; Call regarding Left Leg Pain, Hx of Blood Clots left calf; onset of 03/09/2013 left leg, posterior knee, rates 1-2 on 0-10 scale; emergent sxs r/o per "Leg Non-Injury" protocol except "New pain, aching, cramping or stiffness following exercise/activity that was not part of regular routine or was excess for individual" positive with disposition of "Provide Home/Self Care"; home care advice given, RN advised APAP for pain following the label directions. Protocol(s) Used: Leg Non-Injury Recommended Outcome per Protocol: Provide Home/Self Care Reason for Outcome: New pain, aching, cramping or stiffness following exercise/activity that was not part of regular routine or was excessive for individual Care Advice: Avoid strenuous exercise of affected muscle for several days or as long as pain is felt. Begin to exercise gradually after a few days of rest. ~ ~ See provider if pain continues for 7 days with home care. ~ SYMPTOM / CONDITION MANAGEMENT ~ ACTIVITY / REST ~ CAUTIONS Analgesic/Antipyretic Advice - Acetaminophen: Consider acetaminophen as directed on label or by pharmacist/provider for pain or fever PRECAUTIONS: - Use if there is no history of liver disease, alcoholism, or intake of three or more alcohol drinks per day - Only if approved by provider during pregnancy or when breastfeeding - During pregnancy, acetaminophen should not be taken more than 3 consecutive days without telling provider - Do not exceed recommended dose or frequency ~ If pain begins to improve after 48 hours begin to  exercise again. Consider physical activity that is less stressful. An exercise program that improves range of motion, strength and stamina is beneficial. Avoid long duration exercise; rest at short intervals. ~ 08

## 2013-03-10 NOTE — ED Notes (Signed)
Pt is here with complaints of left leg dvt and is on Xeralto and now pain has moved up to calf.  Pulse present.

## 2013-03-10 NOTE — ED Notes (Signed)
Pt discharged.Vital signs stable and GCS 15 

## 2013-03-10 NOTE — ED Provider Notes (Signed)
CSN: [id number]     Arrival date & time 03/10/13  1412 History     First MD Initiated Contact with Patient 03/10/13 1426     Chief Complaint  Patient presents with  . Leg Pain   (Consider location/radiation/quality/duration/timing/severity/associated sxs/prior Treatment) Patient is a 52 y.o. female presenting with leg pain. The history is provided by the patient.  Leg Pain  Pt presents to the ED for LLE pain x 2 days.  Pt was dx with LLE DVT on 02/21/13, currently on Xarelto which she has been taking daily as directed.  Pt states yesterday morning up on waking she had a sharp pain behind her left knee and now it has moved into her left calf.  No recent injury, trauma, or falls.  Now leg pain described as a "tightness", worse with walking.  Pt states she contacted her PCP yesterday and was encouraged by nurse to try RICE routine and/or heat therapy to help.  Pt has hydrocodone at home from prior surgery but states she has not taken any because they make her constipated.  Denies any chest pain, palpitations, or SOB.  No fevers, sweats, or chills.  Past Medical History  Diagnosis Date  . GERD (gastroesophageal reflux disease)   . Anxiety   . Allergy   . Tobacco use disorder   . Abdominal pain, unspecified site   . Abdominal pain, unspecified site   . Flushing   . Unspecified essential hypertension   . Goiter, unspecified   . Migraine, unspecified, without mention of intractable migraine without mention of status migrainosus   . Allergic rhinitis due to pollen   . Unspecified personal history presenting hazards to health   . DVT (deep venous thrombosis)    Past Surgical History  Procedure Laterality Date  . Thyroidectomy    . Anterior cervical decomp/discectomy fusion N/A 01/15/2013    Procedure: ANTERIOR CERVICAL DECOMPRESSION/DISCECTOMY FUSION 2 LEVELS;  Surgeon: Hewitt Shorts, MD;  Location: [facility];  Service: Neurosurgery;  Laterality: N/A;  Cervical five-six, cervical  six-seven anterior cervical decompression with fusion plating and bonegraft  . Carpal tunnel release Right 01/15/2013    Procedure: CARPAL TUNNEL RELEASE;  Surgeon: Hewitt Shorts, MD;  Location: [facility];  Service: Neurosurgery;  Laterality: Right;  Right Carpal Tunnel Release   Family History  Problem Relation Age of Onset  . Hypertension Mother   . Goiter Mother   . Diabetes Father   . Alcohol abuse Father   . Cancer Neg Hx     breast,colon   History  Substance Use Topics  . Smoking status: Former Smoker    Types: Cigarettes    Quit date: 07/25/2011  . Smokeless tobacco: Not on file     Comment: Pt states she smokes anywhere from 3-5 cigarettes per day.   . Alcohol Use: Yes     Comment: seldom   OB History   Grav Para Term Preterm Abortions TAB SAB Ect Mult Living                 Review of Systems  Musculoskeletal:       Leg pain  All other systems reviewed and are negative.    Allergies  Review of patient's allergies indicates no known allergies.  Home Medications   Current Outpatient Rx  Name  Route  Sig  Dispense  Refill  . ALPRAZolam (XANAX XR) 1 MG 24 hr tablet   Oral   Take 1 tablet (1 mg total) by  mouth every morning. May take twice a day if needed.   60 tablet   1   . aspirin 81 MG EC tablet   Oral   Take 81 mg by mouth daily.           . cetirizine (ZYRTEC) 10 MG tablet   Oral   Take 10 mg by mouth daily.         . famotidine (PEPCID) 20 MG tablet   Oral   Take 20 mg by mouth every morning. May take 1 additional dose if needed         . levothyroxine (SYNTHROID, LEVOTHROID) 125 MCG tablet   Oral   Take 125 mcg by mouth daily before breakfast.         . OVER THE COUNTER MEDICATION   Oral   Take 1 tablet by mouth daily. *otc med called Estra tone*         . Rivaroxaban (XARELTO) 15 MG TABS tablet   Oral   Take 1 tablet (15 mg total) by mouth 2 (two) times daily with a meal.   42 tablet   0    BP 167/97  Pulse 109   Temp(Src) 97.9 F (36.6 C) (Oral)  Resp 18  SpO2 97%  Physical Exam  Nursing note and vitals reviewed. Constitutional: She is oriented to person, place, and time. She appears well-developed and well-nourished. No distress.  HENT:  Head: Normocephalic and atraumatic.  Eyes: Conjunctivae and EOM are normal.  Neck: Normal range of motion. Neck supple.  Cardiovascular: Normal rate, regular rhythm and normal heart sounds.   Pulmonary/Chest: Effort normal and breath sounds normal. No respiratory distress. She has no decreased breath sounds. She has no wheezes.  Musculoskeletal: Normal range of motion.  Left calf TTP, no visible asymmetry or palpable cord; overlying skin normal in appearance; negative Homan's sign; strong distal pulse, sensation intact  Neurological: She is alert and oriented to person, place, and time.  Skin: Skin is warm and dry. She is not diaphoretic.  Psychiatric: She has a normal mood and affect.    ED Course   Procedures (including critical care time)  Labs Reviewed - No data to display No results found.  1. Calf pain, left     MDM   Pt has known DVT in left extremity, currently on xarelto.  No current chest pain or SOB to suggest PE, VS stable.  Do not feel that additional duplex of LLE will change treatment plan.  Pt has not taken any pain medications due to SE-- Instructed she may wish to take OTC stool softener with pain meds to prevent constipation issues.  Continue taking xarelto as directed.  Will FU with PCP if problems occur.  Discussed plan with pt, she agreed.  Return precautions advised.  Discussed with Dr. Anitra Lauth who agrees with assessment and plan.  Garlon Hatchet, PA-C 03/10/13 1616

## 2013-03-13 ENCOUNTER — Encounter: Payer: Self-pay | Admitting: Internal Medicine

## 2013-03-13 ENCOUNTER — Other Ambulatory Visit (INDEPENDENT_AMBULATORY_CARE_PROVIDER_SITE_OTHER): Payer: PRIVATE HEALTH INSURANCE

## 2013-03-13 ENCOUNTER — Ambulatory Visit (INDEPENDENT_AMBULATORY_CARE_PROVIDER_SITE_OTHER): Payer: PRIVATE HEALTH INSURANCE | Admitting: Internal Medicine

## 2013-03-13 VITALS — BP 128/88 | HR 87 | Temp 98.0°F | Resp 16 | Wt 214.0 lb

## 2013-03-13 DIAGNOSIS — E890 Postprocedural hypothyroidism: Secondary | ICD-10-CM

## 2013-03-13 DIAGNOSIS — I82409 Acute embolism and thrombosis of unspecified deep veins of unspecified lower extremity: Secondary | ICD-10-CM | POA: Insufficient documentation

## 2013-03-13 DIAGNOSIS — I82402 Acute embolism and thrombosis of unspecified deep veins of left lower extremity: Secondary | ICD-10-CM

## 2013-03-13 MED ORDER — CELECOXIB 200 MG PO CAPS: 200.0000 mg | ORAL_CAPSULE | Freq: Every day | ORAL | Status: DC

## 2013-03-13 NOTE — Patient Instructions (Signed)
Deep Vein Thrombosis A deep vein thrombosis (DVT) is a blood clot that develops in a deep vein. A DVT is a clot in the deep, larger veins of the leg, arm, or pelvis. These are more dangerous than clots that might form in veins near the surface of the body. A DVT can lead to complications if the clot breaks off and travels in the bloodstream to the lungs.  A DVT can damage the valves in your leg veins, so that instead of flowing upwards, the blood pools in the lower leg. This is called post-thrombotic syndrome, and can result in pain, swelling, discoloration, and sores on the leg. Once identified, a DVT can be treated. It can also be prevented in some circumstances. Once you have had a DVT, you may be at increased risk for a DVT in the future. CAUSES Blood clots form in a vein for different reasons. Usually several things contribute to blood clots. Contributing factors include:  The flow of blood slows down.  The inside of the vein is damaged in some way.  The person has a condition that makes blood clot more easily. Some people are more likely than others to develop blood clots. That is because they have more factors that make clots likely. These are called risk factors. Risk factors include:   Older age, especially over 75 years old.  Having a history of blood clots. This means you have had one before. Or, it means that someone else in your family has had blood clots. You may have a genetic tendency to form clots.  Having major or lengthy surgery. This is especially true for surgery on the hip, knee, or belly (abdomen). Hip surgery is particularly high risk.  Breaking a hip or leg.  Sitting or lying still for a long time. This includes long distance travel, paralysis, or recovery from an illness or surgery.  Cancer, or cancer treatment.  Having a long, thin tube (catheter) placed inside a vein during a medical procedure.  Being overweight (obese).  Pregnancy and childbirth. Hormone  changes make the blood clot more easily during pregnancy. The fetus puts pressure on the veins of the pelvis. There is also risk of injury to veins during delivery or a caesarean. The risk is at its highest just after childbirth.  Medicines with the female hormone estrogen. This includes birth control pills and hormone replacement therapy.  Smoking.  Other circulation or heart problems. SYMPTOMS When a clot forms, it can either partially or totally block the blood flow in that vein. Symptoms of a DVT can include:  Swelling of the leg or arm, especially if one side is much worse.  Warmth and redness of the leg or arm, especially if one side is much worse.  Pain in an arm or leg. If the clot is in the leg, symptoms may be more noticeable or worse when standing or walking. The symptoms of a DVT that has traveled to the lungs (pulmonary embolism, PE) usually start suddenly, and include:  Shortness of breath.  Coughing.  Coughing up blood or blood-tinged phlegm.  Chest pain. The chest pain is often worse with deep breaths.  Rapid heartbeat. Anyone with these symptoms should get emergency medical treatment right away. Call your local emergency services (911 in [place] if you have these symptoms. DIAGNOSIS If a DVT is suspected, your caregiver will take a full medical history and carry out a physical exam. Tests that also may be required include:  Blood tests, including studies of   the clotting properties of the blood.  Ultrasonography to see if you have clots in your legs or lungs.  X-rays to show the flow of blood when dye is injected into the veins (venography).  Studies of your lungs, if you have any chest symptoms. PREVENTION  Exercise the legs regularly. Take a brisk 30 minute walk every day.  Maintain a weight that is appropriate for your height.  Avoid sitting or lying in bed for long periods of time without moving your legs.  Women, particularly those over the age of 35,  should consider the risks and benefits of taking estrogen medicines, including birth control pills.  Do not smoke, especially if you take estrogen medicines.  Long distance travel can increase your risk of DVT. You should exercise your legs by walking or pumping the muscles every hour.  In-hospital prevention:  Many of the risk factors above relate to situations that exist with hospitalization, either for illness, injury, or elective surgery.  Your caregiver will assess you for the need for venous thromboembolism prophylaxis when you are admitted to the hospital. If you are having surgery, your surgeon will assess you the day of or day after surgery.  Prevention may include medical and nonmedical measures. TREATMENT Treatment for DVT helps prevent death and disability. The most common treatment for DVT is blood thinning (anticoagulant) medicine, which reduces the blood's tendency to clot. Anticoagulants can stop new blood clots from forming and old ones from growing. They cannot dissolve existing clots. Your body does this by itself over time. Anticoagulants can be given by mouth, by intravenous (IV) access, or by injection. Your caregiver will determine the best program for you.  Heparin or related medicines (low molecular weight heparin) are usually the first treatment for a blood clot. They act quickly. However, they cannot be taken orally.  Heparin can cause a fall in a component of blood that stops bleeding and forms blood clots (platelets). You will be monitored with blood tests to be sure this does not occur.  Warfarin is an anticoagulant that can be swallowed (taken orally). It takes a few days to start working, so usually heparin or related medicines are used in combination. Once warfarin is working, heparin is usually stopped.  Less commonly, clot dissolving drugs (thrombolytics) are used to dissolve a DVT. They carry a high risk of bleeding, so they are used mainly in severe cases,  where a life or limb is threatened.  Very rarely, a blood clot in the leg needs to be removed surgically.  If you are unable to take anticoagulants, your caregiver may arrange for you to have a filter placed in a main vein in your belly (abdomen). This filter prevents clots from traveling to your lungs. HOME CARE INSTRUCTIONS  Take all medicines prescribed by your caregiver. Follow the directions carefully.  Warfarin. Most people will continue taking warfarin after hospital discharge. Your caregiver will advise you on the length of treatment (usually 3 6 months, sometimes lifelong).  Too much and too little warfarin are both dangerous. Too much warfarin increases the risk of bleeding. Too little warfarin continues to allow the risk for blood clots. While taking warfarin, you will need to have regular blood tests to measure your blood clotting time. These blood tests usually include both the prothrombin time (PT) and international normalized ratio (INR) tests. The PT and INR results allow your caregiver to adjust your dose of warfarin. The dose can change for many reasons. It is critically important that   you take warfarin exactly as prescribed, and that you have your PT and INR levels drawn exactly as directed.  Many foods, especially foods high in vitamin K can interfere with warfarin and affect the PT and INR results. Foods high in vitamin K include spinach, kale, broccoli, cabbage, collard and turnip greens, brussels sprouts, peas, cauliflower, seaweed, and parsley as well as beef and pork liver, green tea, and soybean oil. You should eat a consistent amount of foods high in vitamin K. Avoid major changes in your diet, or notify your caregiver before changing your diet. Arrange a visit with a dietitian to answer your questions.  Many medicines can interfere with warfarin and affect the PT and INR results. You must tell your caregiver about any and all medicines you take, this includes all vitamins  and supplements. Be especially cautious with aspirin and anti-inflammatory medicines. Ask your caregiver before taking these. Do not take or discontinue any prescribed or over-the-counter medicine except on the advice of your caregiver or pharmacist.  Warfarin can have side effects, primarily excessive bruising or bleeding. You will need to hold pressure over cuts for longer than usual. Your caregiver or pharmacist will discuss other potential side effects.  Alcohol can change the body's ability to handle warfarin. It is best to avoid alcoholic drinks or consume only very small amounts while taking warfarin. Notify your caregiver if you change your alcohol intake.  Notify your dentist or other caregivers before procedures.  Activity. Ask your caregiver how soon you can go back to normal activities. It is important to stay active to prevent blood clots. If you are on anticoagulant medicine, avoid contact sports.  Exercise. It is very important to exercise. This is especially important while traveling, sitting or standing for long periods of time. Exercise your legs by walking or by pumping the muscles frequently. Take frequent walks.  Compression stockings. These are tight elastic stockings that apply pressure to the lower legs. This pressure can help keep the blood in the legs from clotting. You may need to wear compressions stockings at home to help prevent a DVT.  Smoking. If you smoke, quit. Ask your caregiver for help with quitting smoking.  Learn as much as you can about DVT. Knowing more about the condition should help you keep it from coming back.  Wear a medical alert bracelet or carry a medical alert card. SEEK MEDICAL CARE IF:  You notice a rapid heartbeat.  You feel weaker or more tired than usual.  You feel faint.  You notice increased bruising.  You feel your symptoms are not getting better in the time expected.  You believe you are having side effects of medicine. SEEK  IMMEDIATE MEDICAL CARE IF:  You have chest pain.  You have trouble breathing.  You have new or increased swelling or pain in one leg.  You cough up blood.  You notice blood in vomit, in a bowel movement, or in urine. MAKE SURE YOU:  Understand these instructions.  Will watch your condition.  Will get help right away if you are not doing well or get worse. Document Released: 07/10/2005 Document Revised: 04/03/2012 Document Reviewed: 09/01/2010 [employer] Patient Information 2014 [employer].  

## 2013-03-13 NOTE — Progress Notes (Signed)
Subjective:    Patient ID: [name], female    DOB: 1960/07/26, 52 y.o.   MRN: [id number]  HPI She was diagnosed with a DVT in her left calf three weeks ago and was doing well with decreasing pain until she returned to work one week ago and since then she has felt like the pain and swelling in her left calf has worsened, She had back surgery about 6 weeks ago and has been taking norco for the pain but she wants something that does not make her feel sleepy.   Review of Systems  Constitutional: Negative.  Negative for fever, chills, diaphoresis, appetite change and fatigue.  HENT: Negative.   Eyes: Negative.   Respiratory: Negative.  Negative for cough, choking, chest tightness, shortness of breath, wheezing and stridor.   Cardiovascular: Positive for leg swelling. Negative for chest pain and palpitations.  Gastrointestinal: Negative for nausea, abdominal pain, diarrhea, constipation and anal bleeding.  Endocrine: Negative.   Genitourinary: Negative.  Negative for hematuria.  Musculoskeletal: Negative.  Negative for myalgias, back pain, joint swelling and gait problem.  Skin: Negative.   Allergic/Immunologic: Negative.   Neurological: Negative.  Negative for dizziness, syncope, weakness and light-headedness.  Hematological: Negative.  Negative for adenopathy. Does not bruise/bleed easily.  Psychiatric/Behavioral: Negative.        Objective:   Physical Exam  Vitals reviewed. Constitutional: She is oriented to person, place, and time. She appears well-developed and well-nourished. No distress.  HENT:  Head: Normocephalic and atraumatic.  Mouth/Throat: Oropharynx is clear and moist. No oropharyngeal exudate.  Eyes: Conjunctivae are normal. Right eye exhibits no discharge. Left eye exhibits no discharge. No scleral icterus.  Neck: Normal range of motion. Neck supple. No JVD present. No tracheal deviation present. No thyromegaly present.  Cardiovascular: Normal rate, regular rhythm,  normal heart sounds and intact distal pulses.  Exam reveals no gallop and no friction rub.   No murmur heard. Pulses:      Carotid pulses are 1+ on the right side, and 1+ on the left side.      Radial pulses are 1+ on the right side, and 1+ on the left side.       Femoral pulses are 1+ on the right side, and 1+ on the left side.      Popliteal pulses are 1+ on the right side, and 1+ on the left side.       Dorsalis pedis pulses are 1+ on the right side, and 1+ on the left side.       Posterior tibial pulses are 1+ on the right side, and 1+ on the left side.  Pulmonary/Chest: Effort normal and breath sounds normal. No stridor. No respiratory distress. She has no wheezes. She has no rales. She exhibits no tenderness.  Abdominal: Soft. Bowel sounds are normal. She exhibits no distension and no mass. There is no tenderness. There is no rebound and no guarding.  Musculoskeletal: Normal range of motion. She exhibits edema (mild non-pitting swelling in her left calf). She exhibits no tenderness.  Lymphadenopathy:    She has no cervical adenopathy.  Neurological: She is oriented to person, place, and time.  Skin: Skin is warm and dry. No rash noted. She is not diaphoretic. No erythema. No pallor.  Psychiatric: She has a normal mood and affect. Her behavior is normal. Judgment and thought content normal.     Lab Results  Component Value Date   WBC 10.9* 01/09/2013   HGB 14.3 02/21/2013  HCT 42.0 02/21/2013   PLT 375 01/09/2013   GLUCOSE 99 02/21/2013   CHOL  Value: 202        ATP III CLASSIFICATION:  <200     mg/dL   Desirable  960-454  mg/dL   Borderline High  >=098    mg/dL   High       * 06/01/1477   TRIG 230* 06/29/2010   HDL 34* 06/29/2010   LDLCALC  Value: 122        Total Cholesterol/HDL:CHD Risk Coronary Heart Disease Risk Table                     Men   Women  1/2 Average Risk   3.4   3.3  Average Risk       5.0   4.4  2 X Average Risk   9.6   7.1  3 X Average Risk  23.4   11.0        Use the  calculated Patient Ratio above and the CHD Risk Table to determine the patient's CHD Risk.        ATP III CLASSIFICATION (LDL):  <100     mg/dL   Optimal  295-621  mg/dL   Near or Above                    Optimal  130-159  mg/dL   Borderline  308-657  mg/dL   High  >846     mg/dL   Very High* 96/08/9526   ALT 19 02/21/2012   AST 15 02/21/2012   NA 140 02/21/2013   K 4.1 02/21/2013   CL 106 02/21/2013   CREATININE 0.80 02/21/2013   BUN 11 02/21/2013   CO2 24 01/09/2013   TSH 0.571 06/28/2010   INR 0.94 06/28/2010   HGBA1C  Value: 5.7 (NOTE)                                                                       According to the ADA Clinical Practice Recommendations for 2011, when HbA1c is used as a screening test:   >=6.5%   Diagnostic of Diabetes Mellitus           (if abnormal result  is confirmed)  5.7-6.4%   Increased risk of developing Diabetes Mellitus  References:Diagnosis and Classification of Diabetes Mellitus,Diabetes Care,2011,34(Suppl 1):S62-S69 and Standards of Medical Care in         Diabetes - 2011,Diabetes Care,2011,34  (Suppl 1):S11-S61.* 06/28/2010       Assessment & Plan:

## 2013-03-14 ENCOUNTER — Encounter: Payer: Self-pay | Admitting: Internal Medicine

## 2013-03-14 NOTE — Assessment & Plan Note (Signed)
I have asked her to stay home and keep the LLE elevated for the next 2 weeks She will continue norco as needed and can add celebrex for additional relief from the pain

## 2013-03-14 NOTE — Assessment & Plan Note (Signed)
Her TSH is in the normal range 

## 2013-03-17 ENCOUNTER — Other Ambulatory Visit: Payer: Self-pay

## 2013-03-17 ENCOUNTER — Telehealth: Payer: Self-pay | Admitting: *Deleted

## 2013-03-17 MED ORDER — RIVAROXABAN 15 MG PO TABS: 15.0000 mg | ORAL_TABLET | Freq: Two times a day (BID) | ORAL | Status: DC

## 2013-03-17 NOTE — Telephone Encounter (Signed)
Pt called requesting whether it is ok to go swimming.  Please advise

## 2013-03-17 NOTE — Telephone Encounter (Signed)
If the wound is closed it should be fine to get in the water.

## 2013-03-18 NOTE — Telephone Encounter (Signed)
Spoke with pt advised of MDs message 

## 2013-03-25 ENCOUNTER — Ambulatory Visit: Payer: PRIVATE HEALTH INSURANCE | Admitting: Internal Medicine

## 2013-03-28 DIAGNOSIS — Z0279 Encounter for issue of other medical certificate: Secondary | ICD-10-CM

## 2013-04-14 ENCOUNTER — Other Ambulatory Visit: Payer: Self-pay | Admitting: Internal Medicine

## 2013-04-15 ENCOUNTER — Other Ambulatory Visit: Payer: Self-pay | Admitting: Internal Medicine

## 2013-04-23 ENCOUNTER — Other Ambulatory Visit: Payer: Self-pay | Admitting: Internal Medicine

## 2013-04-24 ENCOUNTER — Encounter: Payer: Self-pay | Admitting: Internal Medicine

## 2013-04-24 ENCOUNTER — Ambulatory Visit (INDEPENDENT_AMBULATORY_CARE_PROVIDER_SITE_OTHER): Payer: PRIVATE HEALTH INSURANCE | Admitting: Internal Medicine

## 2013-04-24 ENCOUNTER — Ambulatory Visit: Payer: PRIVATE HEALTH INSURANCE | Admitting: Internal Medicine

## 2013-04-24 VITALS — BP 130/82 | HR 69 | Temp 98.0°F | Wt 218.8 lb

## 2013-04-24 DIAGNOSIS — I82409 Acute embolism and thrombosis of unspecified deep veins of unspecified lower extremity: Secondary | ICD-10-CM

## 2013-04-24 DIAGNOSIS — I10 Essential (primary) hypertension: Secondary | ICD-10-CM

## 2013-04-24 DIAGNOSIS — R232 Flushing: Secondary | ICD-10-CM

## 2013-04-24 NOTE — Patient Instructions (Addendum)
Good to see you, especially since you are feeling better.  Take celebrex as needed.  After a full three months of Xeralto you can stop - MyChart me a message: doing well, legs without swelling or pain. I will text back - ok to stop Xeralto  Climcateric - Yam extract, black cohosh may help. Phytoestrogens, red clover and gabapentin are other potential drugs/herbals that may help.  Keep up the gym work.

## 2013-04-24 NOTE — Progress Notes (Signed)
  Subjective:    Patient ID: [name], female    DOB: 03/27/1961, 52 y.o.   MRN: [id number]  HPI [name] presents for follow up of DVT - she has been on Xeralto. She is tolerating this well. She has not leg pain and no noticeable swelling.  She reports that she is having marked symptoms of the climacteric. She does not want to take any hormone replacement  PMH, FamHx and SocHx reviewed for any changes and relevance.  Current Outpatient Prescriptions on File Prior to Visit  Medication Sig Dispense Refill  . ALPRAZolam (XANAX XR) 1 MG 24 hr tablet Take 1 tablet (1 mg total) by mouth every morning. May take twice a day if needed.  60 tablet  1  . aspirin 81 MG EC tablet Take 81 mg by mouth daily.        . CELEBREX 200 MG capsule TAKE ONE CAPSULE BY MOUTH ONCE DAILY  90 capsule  1  . cetirizine (ZYRTEC) 10 MG tablet Take 10 mg by mouth daily.      . famotidine (PEPCID) 20 MG tablet Take 20 mg by mouth every morning. May take 1 additional dose if needed      . levothyroxine (SYNTHROID, LEVOTHROID) 125 MCG tablet Take 125 mcg by mouth daily before breakfast.      . levothyroxine (SYNTHROID, LEVOTHROID) 125 MCG tablet TAKE ONE TABLET BY MOUTH EVERY DAY  30 tablet  5  . OVER THE COUNTER MEDICATION Take 1 tablet by mouth daily. *otc med called Estra tone*      . XARELTO 15 MG TABS tablet TAKE ONE TABLET BY MOUTH TWICE DAILY WITH A MEAL  42 tablet  0   No current facility-administered medications on file prior to visit.      Review of Systems System review is negative for any constitutional, cardiac, pulmonary, GI or neuro symptoms or complaints other than as described in the HPI.     Objective:   Physical Exam Filed Vitals:   04/24/13 1503  BP: 130/82  Pulse: 69  Temp: 98 F (36.7 C)   Wt Readings from Last 3 Encounters:  04/24/13 218 lb 12.8 oz (99.247 kg)  03/13/13 214 lb (97.07 kg)  02/27/13 218 lb (98.884 kg)   Gen'l- WNWD overweight woman in no distress. Cor-  RRR Pulm - normal respirations Ext - no swelling at the calve and no tenderness.       Assessment & Plan:

## 2013-04-28 NOTE — Assessment & Plan Note (Signed)
Symptomatic climacteric.  Plan No interest in HRT.   No interest in trying SSRI or SSRI/NE products  Natural alternatives discussed: Yam extract, black cohosh may help. Phytoestrogens, red clover and gabapentin are other potential drugs/herbals that may help

## 2013-04-28 NOTE — Assessment & Plan Note (Signed)
BP Readings from Last 3 Encounters:  04/24/13 130/82  03/13/13 128/88  03/10/13 167/97

## 2013-04-28 NOTE — Assessment & Plan Note (Signed)
Positive LE venous doppler August 1, '14 - started on [place] for 3 months. Taking and tolerating medication.  Plan D/c Xeralto Nov 1.

## 2013-05-10 ENCOUNTER — Other Ambulatory Visit: Payer: Self-pay | Admitting: Internal Medicine

## 2013-05-12 ENCOUNTER — Telehealth: Payer: Self-pay | Admitting: Internal Medicine

## 2013-05-12 NOTE — Telephone Encounter (Signed)
10.20.2014   Pt called in stating that she had been taken off here BP meds.  This morning the BP reading was 163/108 and pt feels that she needs to be back on the meds.  Pt just wanted to let Dr. Debby Bud know.  Call pt if needed.

## 2013-05-29 ENCOUNTER — Other Ambulatory Visit: Payer: Self-pay

## 2013-06-03 ENCOUNTER — Other Ambulatory Visit: Payer: Self-pay | Admitting: Neurosurgery

## 2013-06-08 ENCOUNTER — Encounter: Payer: Self-pay | Admitting: Internal Medicine

## 2013-06-10 ENCOUNTER — Other Ambulatory Visit: Payer: Self-pay | Admitting: Internal Medicine

## 2013-06-10 ENCOUNTER — Encounter (HOSPITAL_COMMUNITY): Payer: Self-pay | Admitting: Pharmacy Technician

## 2013-06-14 NOTE — Pre-Procedure Instructions (Addendum)
[name]  06/14/2013   Your procedure is scheduled on:  December 3  Report to [facility] Entrance "A" [address] at 09:15 AM.  Call this number if you have problems the morning of surgery: [phone]   Remember:   Do not eat food or drink liquids after midnight.   Take these medicines the morning of surgery with A SIP OF WATER: Xanax (if needed), Zyrtec, Pepcid, Levothyroxine   STOP [place], Multiple Vitamins, Calcium, Black Cohosh, Aspirin November 26    STOP/ Do not take Aspirin, Aleve, Naproxen, Advil, Ibuprofen, Vitamin, Herbs, and Supplements starting November 26   Do not wear jewelry, make-up or nail polish.  Do not wear lotions, powders, or perfumes. You may wear deodorant.  Do not shave 48 hours prior to surgery. Men may shave face and neck.  Do not bring valuables to the hospital.  [facility] is not responsible                  for any belongings or valuables.               Contacts, dentures or bridgework may not be worn into surgery.  Leave suitcase in the car. After surgery it may be brought to your room.  For patients admitted to the hospital, discharge time is determined by your                treatment team.               Patients discharged the day of surgery will not be allowed to drive  home.  Name and phone number of your driver: Family/ Friend  Special Instructions: Shower using CHG 2 nights before surgery and the night before surgery.  If you shower the day of surgery use CHG.  Use special wash - you have one bottle of CHG for all showers.  You should use approximately 1/3 of the bottle for each shower.   Please read over the following fact sheets that you were given: Pain Booklet, Coughing and Deep Breathing and Surgical Site Infection Prevention

## 2013-06-16 ENCOUNTER — Other Ambulatory Visit (HOSPITAL_COMMUNITY): Payer: Self-pay | Admitting: *Deleted

## 2013-06-16 ENCOUNTER — Encounter (HOSPITAL_COMMUNITY)
Admission: RE | Admit: 2013-06-16 | Discharge: 2013-06-16 | Disposition: A | Discharge status: Home / Self Care | Payer: PRIVATE HEALTH INSURANCE | Source: Ambulatory Visit | Attending: Neurosurgery | Admitting: Neurosurgery

## 2013-06-16 ENCOUNTER — Encounter (HOSPITAL_COMMUNITY): Payer: Self-pay

## 2013-06-16 DIAGNOSIS — Z01818 Encounter for other preprocedural examination: Secondary | ICD-10-CM | POA: Insufficient documentation

## 2013-06-16 DIAGNOSIS — Z01812 Encounter for preprocedural laboratory examination: Secondary | ICD-10-CM | POA: Insufficient documentation

## 2013-06-16 HISTORY — DX: Hypothyroidism, unspecified: E03.9

## 2013-06-16 HISTORY — DX: Atherosclerotic heart disease of native coronary artery without angina pectoris: I25.10

## 2013-06-16 LAB — CBC
MCH: 31.8 pg (ref 26.0–34.0)
MCHC: 34.8 g/dL (ref 30.0–36.0)
MCV: 91.5 fL (ref 78.0–100.0)
Platelets: 357 10*3/uL (ref 150–400)
RDW: 13.8 % (ref 11.5–15.5)

## 2013-06-16 LAB — BASIC METABOLIC PANEL
Calcium: 10.4 mg/dL (ref 8.4–10.5)
Creatinine, Ser: 0.85 mg/dL (ref 0.50–1.10)
GFR calc Af Amer: >90 mL/min (ref >90)
GFR calc non Af Amer: 78 mL/min — ABNORMAL LOW (ref >90)
Glucose, Bld: 89 mg/dL (ref 70–99)
Sodium: 139 mEq/L (ref 135–145)

## 2013-06-16 NOTE — Progress Notes (Addendum)
On xarelto for dvt until 05/26/13 d/c'd. Note from cath by dr Myrtis Ser in epic 12/11 no visit since that time.

## 2013-06-24 MED ORDER — CEFAZOLIN SODIUM-DEXTROSE 2-3 GM-% IV SOLR
2.0000 g | INTRAVENOUS | Status: AC
  Administered 2013-06-25: 2 g via INTRAVENOUS
  Filled 2013-06-24: qty 50

## 2013-06-25 ENCOUNTER — Encounter (HOSPITAL_COMMUNITY)
Admission: RE | Disposition: A | Discharge status: Home / Self Care | Payer: Self-pay | Source: Ambulatory Visit | Attending: Neurosurgery

## 2013-06-25 ENCOUNTER — Observation Stay (HOSPITAL_COMMUNITY)
Admission: RE | Admit: 2013-06-25 | Discharge: 2013-06-25 | Disposition: A | Discharge status: Home / Self Care | Payer: PRIVATE HEALTH INSURANCE | Source: Ambulatory Visit | Attending: Neurosurgery | Admitting: Neurosurgery

## 2013-06-25 ENCOUNTER — Encounter (HOSPITAL_COMMUNITY): Payer: Self-pay | Admitting: Anesthesiology

## 2013-06-25 ENCOUNTER — Encounter (HOSPITAL_COMMUNITY): Payer: PRIVATE HEALTH INSURANCE | Admitting: Certified Registered Nurse Anesthetist

## 2013-06-25 ENCOUNTER — Inpatient Hospital Stay (HOSPITAL_COMMUNITY): Payer: PRIVATE HEALTH INSURANCE | Admitting: Certified Registered Nurse Anesthetist

## 2013-06-25 DIAGNOSIS — R109 Unspecified abdominal pain: Secondary | ICD-10-CM | POA: Insufficient documentation

## 2013-06-25 DIAGNOSIS — F411 Generalized anxiety disorder: Secondary | ICD-10-CM | POA: Insufficient documentation

## 2013-06-25 DIAGNOSIS — Z79899 Other long term (current) drug therapy: Secondary | ICD-10-CM | POA: Insufficient documentation

## 2013-06-25 DIAGNOSIS — G5600 Carpal tunnel syndrome, unspecified upper limb: Principal | ICD-10-CM | POA: Insufficient documentation

## 2013-06-25 DIAGNOSIS — Z9889 Other specified postprocedural states: Secondary | ICD-10-CM | POA: Insufficient documentation

## 2013-06-25 DIAGNOSIS — F17200 Nicotine dependence, unspecified, uncomplicated: Secondary | ICD-10-CM | POA: Insufficient documentation

## 2013-06-25 DIAGNOSIS — R232 Flushing: Secondary | ICD-10-CM | POA: Insufficient documentation

## 2013-06-25 DIAGNOSIS — G43909 Migraine, unspecified, not intractable, without status migrainosus: Secondary | ICD-10-CM | POA: Insufficient documentation

## 2013-06-25 DIAGNOSIS — I2510 Atherosclerotic heart disease of native coronary artery without angina pectoris: Secondary | ICD-10-CM | POA: Insufficient documentation

## 2013-06-25 DIAGNOSIS — E049 Nontoxic goiter, unspecified: Secondary | ICD-10-CM | POA: Insufficient documentation

## 2013-06-25 DIAGNOSIS — G5620 Lesion of ulnar nerve, unspecified upper limb: Secondary | ICD-10-CM | POA: Diagnosis present

## 2013-06-25 DIAGNOSIS — E039 Hypothyroidism, unspecified: Secondary | ICD-10-CM | POA: Insufficient documentation

## 2013-06-25 DIAGNOSIS — K219 Gastro-esophageal reflux disease without esophagitis: Secondary | ICD-10-CM | POA: Insufficient documentation

## 2013-06-25 DIAGNOSIS — I10 Essential (primary) hypertension: Secondary | ICD-10-CM | POA: Insufficient documentation

## 2013-06-25 DIAGNOSIS — G5680 Other specified mononeuropathies of unspecified upper limb: Secondary | ICD-10-CM | POA: Insufficient documentation

## 2013-06-25 HISTORY — PX: CARPAL TUNNEL RELEASE: SHX101

## 2013-06-25 HISTORY — PX: ULNAR NERVE TRANSPOSITION: SHX2595

## 2013-06-25 SURGERY — ULNAR NERVE DECOMPRESSION/TRANSPOSITION
Anesthesia: General | Laterality: Left

## 2013-06-25 MED ORDER — ONDANSETRON HCL 4 MG/2ML IJ SOLN: 4.0000 mg | Freq: Once | INTRAMUSCULAR | Status: DC | PRN

## 2013-06-25 MED ORDER — MORPHINE SULFATE 4 MG/ML IJ SOLN: 4.0000 mg | INTRAMUSCULAR | Status: DC | PRN

## 2013-06-25 MED ORDER — MENTHOL 3 MG MT LOZG: 1.0000 | LOZENGE | OROMUCOSAL | Status: DC | PRN

## 2013-06-25 MED ORDER — FENTANYL CITRATE 0.05 MG/ML IJ SOLN
INTRAMUSCULAR | Status: DC | PRN
  Administered 2013-06-25: 100 ug via INTRAVENOUS

## 2013-06-25 MED ORDER — ACETAMINOPHEN 325 MG PO TABS: 650.0000 mg | ORAL_TABLET | ORAL | Status: DC | PRN

## 2013-06-25 MED ORDER — LEVOTHYROXINE SODIUM 125 MCG PO TABS
125.0000 ug | ORAL_TABLET | Freq: Every day | ORAL | Status: DC
  Filled 2013-06-25: qty 1

## 2013-06-25 MED ORDER — LACTATED RINGERS IV SOLN
INTRAVENOUS | Status: DC
  Administered 2013-06-25: 11:00:00 via INTRAVENOUS

## 2013-06-25 MED ORDER — ONDANSETRON HCL 4 MG/2ML IJ SOLN: 4.0000 mg | Freq: Four times a day (QID) | INTRAMUSCULAR | Status: DC | PRN

## 2013-06-25 MED ORDER — HYDROMORPHONE HCL PF 1 MG/ML IJ SOLN: 0.2500 mg | INTRAMUSCULAR | Status: DC | PRN

## 2013-06-25 MED ORDER — LIDOCAINE-EPINEPHRINE 1 %-1:100000 IJ SOLN
INTRAMUSCULAR | Status: DC | PRN
  Administered 2013-06-25: 5 mL via INTRADERMAL

## 2013-06-25 MED ORDER — MAGNESIUM HYDROXIDE 400 MG/5ML PO SUSP: 30.0000 mL | Freq: Every day | ORAL | Status: DC | PRN

## 2013-06-25 MED ORDER — ALPRAZOLAM ER 1 MG PO TB24: 1.0000 mg | ORAL_TABLET | Freq: Two times a day (BID) | ORAL | Status: DC | PRN

## 2013-06-25 MED ORDER — HYDROCODONE-ACETAMINOPHEN 5-325 MG PO TABS: 1.0000 | ORAL_TABLET | ORAL | Status: DC | PRN

## 2013-06-25 MED ORDER — LIDOCAINE HCL (CARDIAC) 20 MG/ML IV SOLN
INTRAVENOUS | Status: DC | PRN
  Administered 2013-06-25: 60 mg via INTRAVENOUS

## 2013-06-25 MED ORDER — PROPOFOL 10 MG/ML IV BOLUS
INTRAVENOUS | Status: DC | PRN
  Administered 2013-06-25: 200 mg via INTRAVENOUS

## 2013-06-25 MED ORDER — KETOROLAC TROMETHAMINE 30 MG/ML IJ SOLN: 30.0000 mg | Freq: Four times a day (QID) | INTRAMUSCULAR | Status: DC

## 2013-06-25 MED ORDER — KETOROLAC TROMETHAMINE 30 MG/ML IJ SOLN
30.0000 mg | Freq: Once | INTRAMUSCULAR | Status: AC
  Administered 2013-06-25: 30 mg via INTRAVENOUS

## 2013-06-25 MED ORDER — PHENOL 1.4 % MT LIQD: 1.0000 | OROMUCOSAL | Status: DC | PRN

## 2013-06-25 MED ORDER — ALUM & MAG HYDROXIDE-SIMETH 200-200-20 MG/5ML PO SUSP: 30.0000 mL | Freq: Four times a day (QID) | ORAL | Status: DC | PRN

## 2013-06-25 MED ORDER — SODIUM CHLORIDE 0.9 % IV SOLN: 250.0000 mL | INTRAVENOUS | Status: DC

## 2013-06-25 MED ORDER — CYCLOBENZAPRINE HCL 10 MG PO TABS: 10.0000 mg | ORAL_TABLET | Freq: Three times a day (TID) | ORAL | Status: DC | PRN

## 2013-06-25 MED ORDER — BUPIVACAINE HCL 0.5 % IJ SOLN
INTRAMUSCULAR | Status: DC | PRN
  Administered 2013-06-25: 5 mL

## 2013-06-25 MED ORDER — ONDANSETRON HCL 4 MG/2ML IJ SOLN
INTRAMUSCULAR | Status: DC | PRN
  Administered 2013-06-25: 4 mg via INTRAVENOUS

## 2013-06-25 MED ORDER — ACETAMINOPHEN 650 MG RE SUPP: 650.0000 mg | RECTAL | Status: DC | PRN

## 2013-06-25 MED ORDER — SODIUM CHLORIDE 0.9 % IJ SOLN: 3.0000 mL | Freq: Two times a day (BID) | INTRAMUSCULAR | Status: DC

## 2013-06-25 MED ORDER — SODIUM CHLORIDE 0.9 % IJ SOLN: 3.0000 mL | INTRAMUSCULAR | Status: DC | PRN

## 2013-06-25 MED ORDER — BISACODYL 10 MG RE SUPP: 10.0000 mg | Freq: Every day | RECTAL | Status: DC | PRN

## 2013-06-25 MED ORDER — ZOLPIDEM TARTRATE 5 MG PO TABS: 5.0000 mg | ORAL_TABLET | Freq: Every evening | ORAL | Status: DC | PRN

## 2013-06-25 MED ORDER — LACTATED RINGERS IV SOLN
INTRAVENOUS | Status: DC | PRN
  Administered 2013-06-25: 12:00:00 via INTRAVENOUS

## 2013-06-25 MED ORDER — HYDROXYZINE HCL 25 MG PO TABS: 50.0000 mg | ORAL_TABLET | ORAL | Status: DC | PRN

## 2013-06-25 MED ORDER — OXYCODONE-ACETAMINOPHEN 5-325 MG PO TABS: 1.0000 | ORAL_TABLET | ORAL | Status: DC | PRN

## 2013-06-25 MED ORDER — KCL IN DEXTROSE-NACL 20-5-0.45 MEQ/L-%-% IV SOLN
INTRAVENOUS | Status: DC
  Filled 2013-06-25 (×2): qty 1000

## 2013-06-25 SURGICAL SUPPLY — 36 items
ADH SKN CLS APL DERMABOND .7 (GAUZE/BANDAGES/DRESSINGS) ×2
BANDAGE GAUZE 4  KLING STR (GAUZE/BANDAGES/DRESSINGS) ×2 IMPLANT
BANDAGE GAUZE ELAST BULKY 4 IN (GAUZE/BANDAGES/DRESSINGS) ×1 IMPLANT
BLADE SURG 10 STRL SS (BLADE) ×3 IMPLANT
BLADE SURG 15 STRL LF DISP TIS (BLADE) ×2 IMPLANT
BLADE SURG 15 STRL SS (BLADE) ×4
BRUSH SCRUB EZ PLAIN DRY (MISCELLANEOUS) ×2 IMPLANT
CORDS BIPOLAR (ELECTRODE) ×2 IMPLANT
DERMABOND ADVANCED (GAUZE/BANDAGES/DRESSINGS) ×2
DERMABOND ADVANCED .7 DNX12 (GAUZE/BANDAGES/DRESSINGS) IMPLANT
DRAPE EXTREMITY T 121X128X90 (DRAPE) ×3 IMPLANT
DRAPE PROXIMA HALF (DRAPES) ×1 IMPLANT
DRSG EMULSION OIL 3X3 NADH (GAUZE/BANDAGES/DRESSINGS) ×2 IMPLANT
GAUZE SPONGE 4X4 16PLY XRAY LF (GAUZE/BANDAGES/DRESSINGS) ×2 IMPLANT
GLOVE BIOGEL PI IND STRL 8 (GLOVE) ×1 IMPLANT
GLOVE BIOGEL PI INDICATOR 8 (GLOVE) ×1
GLOVE ECLIPSE 7.5 STRL STRAW (GLOVE) ×4 IMPLANT
GLOVE EXAM NITRILE LRG STRL (GLOVE) IMPLANT
GLOVE EXAM NITRILE MD LF STRL (GLOVE) ×2 IMPLANT
GLOVE EXAM NITRILE XL STR (GLOVE) ×2 IMPLANT
GLOVE EXAM NITRILE XS STR PU (GLOVE) IMPLANT
GOWN BRE IMP SLV AUR XL STRL (GOWN DISPOSABLE) ×2 IMPLANT
GOWN STRL REIN 2XL LVL4 (GOWN DISPOSABLE) IMPLANT
KIT BASIN OR (CUSTOM PROCEDURE TRAY) ×2 IMPLANT
KIT ROOM TURNOVER OR (KITS) ×2 IMPLANT
PACK SURGICAL SETUP 50X90 (CUSTOM PROCEDURE TRAY) ×2 IMPLANT
PAD ARMBOARD 7.5X6 YLW CONV (MISCELLANEOUS) ×6 IMPLANT
PENCIL BUTTON HOLSTER BLD 10FT (ELECTRODE) ×1 IMPLANT
SPONGE GAUZE 4X4 12PLY (GAUZE/BANDAGES/DRESSINGS) ×2 IMPLANT
SPONGE LAP 4X18 X RAY DECT (DISPOSABLE) ×1 IMPLANT
STOCKINETTE 4X48 STRL (DRAPES) ×2 IMPLANT
SUT ETHILON 4 0 PS 2 18 (SUTURE) ×3 IMPLANT
SUT VIC AB 3-0 SH 8-18 (SUTURE) ×2 IMPLANT
TOWEL OR 17X24 6PK STRL BLUE (TOWEL DISPOSABLE) ×5 IMPLANT
TUBE CONNECTING 12X1/4 (SUCTIONS) ×1 IMPLANT
UNDERPAD 30X30 INCONTINENT (UNDERPADS AND DIAPERS) ×2 IMPLANT

## 2013-06-25 NOTE — Transfer of Care (Signed)
Immediate Anesthesia Transfer of Care Note  Patient: [name]  Procedure(s) Performed: Procedure(s) with comments: ULNAR NERVE DECOMPRESSION/TRANSPOSITION (Left) - left ulnar nerve decompression CARPAL TUNNEL RELEASE (Left) - left carpal tunnel release  Patient Location: PACU  Anesthesia Type:General  Level of Consciousness: awake, alert  and oriented  Airway & Oxygen Therapy: Patient Spontanous Breathing and Patient connected to nasal cannula oxygen  Post-op Assessment: Report given to PACU RN and Post -op Vital signs reviewed and stable  Post vital signs: Reviewed and stable  Complications: No apparent anesthesia complications

## 2013-06-25 NOTE — H&P (Signed)
Subjective: Patient is a 52 y.o. female who is admitted for treatment of left ulnar neuropathy and left carpal tunnel syndrome.   He has 5 and 1/2 months status post a 2 level C5-6 and C6-7 ACDF and right carpal tunnel release. Her postoperative course was complicated by a DVT that was treated with Lesia Hausen. The Lesia Hausen has been discontinued, and she is admitted now for a left ulnar nerve decompression and left carpal tunnel release.   Patient Active Problem List   Diagnosis Date Noted  . DVT (deep venous thrombosis) 03/13/2013  . Obesity, Class I, BMI 30-34.9 02/28/2013  . CHEST PAIN, ATYPICAL 06/28/2010  . 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 TOBACCO ABUSE 04/30/2009  . Unspecified essential hypertension 10/18/2007  . Flushing 10/18/2007  . ABDOMINAL PAIN, UNSPECIFIED SITE 10/18/2007  . GOITER 07/13/2007  . ANXIETY 07/13/2007  . MIGRAINE HEADACHE 07/13/2007  . HAY FEVER 07/13/2007  . GERD 07/13/2007  . ALLERGY 07/13/2007   Past Medical History  Diagnosis Date  . GERD (gastroesophageal reflux disease)   . Anxiety   . Allergy   . Tobacco use disorder   . Abdominal pain, unspecified site   . Abdominal pain, unspecified site   . Flushing   . Unspecified essential hypertension   . Goiter, unspecified   . Migraine, unspecified, without mention of intractable migraine without mention of status migrainosus   . Allergic rhinitis due to pollen   . Unspecified personal history presenting hazards to health   . DVT (deep venous thrombosis) 8/14  . Hypothyroidism   . Coronary artery disease     Past Surgical History  Procedure Laterality Date  . Thyroidectomy    . Anterior cervical decomp/discectomy fusion N/A 01/15/2013    Procedure: ANTERIOR CERVICAL DECOMPRESSION/DISCECTOMY FUSION 2 LEVELS;  Surgeon: Hewitt Shorts, MD;  Location: [facility];  Service: Neurosurgery;  Laterality: N/A;  Cervical five-six, cervical six-seven anterior cervical decompression with fusion plating  and bonegraft  . Carpal tunnel release Right 01/15/2013    Procedure: CARPAL TUNNEL RELEASE;  Surgeon: Hewitt Shorts, MD;  Location: [facility];  Service: Neurosurgery;  Laterality: Right;  Right Carpal Tunnel Release  . Cardiac catheterization  11    Prescriptions prior to admission  Medication Sig Dispense Refill  . ALPRAZolam (XANAX XR) 1 MG 24 hr tablet Take 1 mg by mouth 2 (two) times daily as needed for anxiety.      Marland Kitchen aspirin 81 MG EC tablet Take 81 mg by mouth daily.        Marland Kitchen BLACK COHOSH PO Take 1 tablet by mouth daily.      . Calcium Carbonate-Vitamin D (CALTRATE 600+D PO) Take 1 tablet by mouth daily.      . cetirizine (ZYRTEC) 10 MG tablet Take 10 mg by mouth daily.      . famotidine (PEPCID) 20 MG tablet Take 20 mg by mouth every morning. May take 1 additional dose if needed      . levothyroxine (SYNTHROID, LEVOTHROID) 125 MCG tablet Take 125 mcg by mouth daily before breakfast.      . Multiple Vitamins-Minerals (MULTIVITAMIN WITH MINERALS) tablet Take 1 tablet by mouth daily.      Marland Kitchen OVER THE COUNTER MEDICATION Take 1 tablet by mouth daily. *otc med called Estra tone*       No Known Allergies  History  Substance Use Topics  . Smoking status: Current Every Day Smoker -- 0.25 packs/day for 38 years    Types: Cigarettes  .  Smokeless tobacco: Never Used     Comment: Pt states she smokes anywhere from 3-5 cigarettes per day. social drinker ,marijuna last wk  . Alcohol Use: Yes     Comment: seldom    Family History  Problem Relation Age of Onset  . Hypertension Mother   . Goiter Mother   . Diabetes Father   . Alcohol abuse Father   . Cancer Neg Hx     breast,colon     Review of Systems A comprehensive review of systems was negative.  Objective: Vital signs in last 24 hours: Temp:  [97.1 F (36.2 C)] 97.1 F (36.2 C) (12/03 0927) Pulse Rate:  [70] 70 (12/03 0927) Resp:  [20] 20 (12/03 0927) BP: (132)/(75) 132/75 mmHg (12/03 0927) SpO2:  [97 %] 97 % (12/03  0927)  EXAM: Patient is a well-developed well-nourished white female in no acute distress. Lungs are clear to auscultation , the patient has symmetrical respiratory excursion. Heart has a regular rate and rhythm normal S1 and S2 no murmur.   Abdomen is soft nontender nondistended bowel sounds are present. Extremity examination shows no clubbing cyanosis or edema. She has a positive Tinel's over the left epicondylar groove and a positive Tinel's at the left carpal tunnel. Neurologic examination shows 5/5 strength in the deltoids, biceps, and triceps bilaterally, as well as the right intrinsics and grip; however the left intrinsics are 4/5 in the left grip is 4+ to 5/5. There is mild atrophy of the left interosseus muscles. Sensory examination shows decreased sensation to pinprick in the left hand, more so in the left fifth digit and medial aspect of the left fourth digit, but some throughout all digits of left hand. Reflexes are one in the biceps, triceps, and brachioradialis; the quadriceps are 1 bilaterally; the left gastrocnemius is one, the right gastrocnemius is absent. Toes are downgoing bilaterally. She has a normal gait and stance.  Data Review:CBC    Component Value Date/Time   WBC 11.5* 06/16/2013 1528   RBC 4.71 06/16/2013 1528   HGB 15.0 06/16/2013 1528   HCT 43.1 06/16/2013 1528   PLT 357 06/16/2013 1528   MCV 91.5 06/16/2013 1528   MCH 31.8 06/16/2013 1528   MCHC 34.8 06/16/2013 1528   RDW 13.8 06/16/2013 1528   LYMPHSABS 4.8* 06/28/2010 1741   MONOABS 1.0 06/28/2010 1741   EOSABS 0.1 06/28/2010 1741   BASOSABS 0.0 06/28/2010 1741                          BMET    Component Value Date/Time   NA 139 06/16/2013 1528   K 3.7 06/16/2013 1528   CL 103 06/16/2013 1528   CO2 27 06/16/2013 1528   GLUCOSE 89 06/16/2013 1528   BUN 16 06/16/2013 1528   CREATININE 0.85 06/16/2013 1528   CALCIUM 10.4 06/16/2013 1528   CALCIUM 10.5 10/18/2007 1935   GFRNONAA 78* 06/16/2013 1528   GFRAA  >90 06/16/2013 1528     Assessment/Plan: Patient with left ulnar neuropathy and left carpal tunnel syndrome, admitted for left ulnar nerve decompression and left carpal tunnel release.  I've discussed with the patient the nature of his condition, the nature the surgical procedure, the typical length of surgery, hospital stay, and overall recuperation. We discussed limitations postoperatively. I discussed risks of surgery including risks of infection, bleeding, possibly need for transfusion, the risk of nerve dysfunction with pain, weakness, numbness, or paresthesias, the possible need for further surgery,  and the risk of anesthetic complications including myocardial infarction, stroke, pneumonia, and death.  Understanding all this the patient does wish to proceed with surgery and is admitted for such.    Hewitt Shorts, MD 06/25/2013 11:26 AM

## 2013-06-25 NOTE — Progress Notes (Signed)
Pt doing well. Pt and husband given D/C instructions with verbal understanding. Pt D/C home via walking per MD order. Pt stable @ D/C and had no other needs. Rema Fendt, RN

## 2013-06-25 NOTE — Preoperative (Signed)
Beta Blockers   Reason not to administer Beta Blockers:Not Applicable 

## 2013-06-25 NOTE — Op Note (Signed)
06/25/2013  1:22 PM  PATIENT:  [name]  52 y.o. female  PRE-OPERATIVE DIAGNOSIS:  left carpal tunnel syndrome, left ulnar neuropathy  POST-OPERATIVE DIAGNOSIS:  left carpal tunnel syndrome, left ulnar neuropathy  PROCEDURE:  Procedure(s): LEFT ULNAR NERVE DECOMPRESSION LEFT CARPAL TUNNEL RELEASE  SURGEON:  Surgeon(s): Hewitt Shorts, MD Barnett Abu, MD  ASSISTANTS: Barnett Abu, M.D.  ANESTHESIA:   general  EBL:  Total I/O In: 500 [I.V.:500] Out: -   BLOOD ADMINISTERED:none  COUNT: Correct per nursing staff  DICTATION: Patient was brought to the operating room placed under general anesthesia with an LMA by the anesthesia service. The left upper extremity was prepped with Betadine soap and solution and draped in a sterile fashion. We first performed the left ulnar nerve decompression. The overlying skin and subcutaneous tissue infiltrated with local anesthetic with epinephrine. Incision was made, and dissection was carried down through the subcutaneous tissue. This was carried down to the left epicondylar groove, and the overlying fascial tissues were carefully opened and the proximal aspect of the left ulnar nerve was identified. We began to dissect over the ulnar nerve and found a thickened retinaculum which divided. It essentially was the retinaculum of the flexor carpi ulnaris. It was divided from proximal to distal, decompress the underlying ulnar nerve. We examined the nerve proximal to the epicondylar groove, and distal to the epicondylar groove, and found to be fully decompressed. We then proceeded with closure. The subcutaneous and subcuticular closed with interrupted inverted 3-0 Vicryl suture. Skin is approximate Dermabond. The wound was dressed with sterile gauze and wrapped with a Kerlix.  I then proceeded with the left carpal tunnel release. Again the skin and subcutaneous tissue a straight local anesthetic with epinephrine. Incision was made beginning just distal  to the distal carpal crease, and just medial to the thenar crease. Dissection carried out into the proximal palm. Dissection was carried down to the subcutaneous tissue to the transverse carpal ligament. It was carefully divided, taking care to leave the underlying structures undisturbed. It was opened its full distal to proximal extent, with good decompression of the underlying median nerve. We then proceeded with closure. Subcutaneous and subcuticular closed with interrupted inverted 2-0 Vicryl suture. Skin is closed with interrupted 4-0 nylon sutures placed in a interrupted horizontal mattress fashion. The was dressed with sterile gauze and wrapped with a Kling. Following surgery the patient reversed an anesthetic, the LMA was removed, and she was transferred to the recovery room for further care.  PLAN OF CARE: Admit for observation  PATIENT DISPOSITION:  PACU - hemodynamically stable.   Delay start of Pharmacological VTE agent (>24hrs) due to surgical blood loss or risk of bleeding:  yes

## 2013-06-25 NOTE — Anesthesia Preprocedure Evaluation (Addendum)
Anesthesia Evaluation  Patient identified by MRN, date of birth, ID band Patient awake    Reviewed: Allergy & Precautions, H&P , NPO status , Patient's Chart, lab work & pertinent test results  Airway       Dental   Pulmonary Current Smoker,          Cardiovascular hypertension, + CAD and + Peripheral Vascular Disease     Neuro/Psych  Headaches, Anxiety    GI/Hepatic GERD-  ,  Endo/Other  Hypothyroidism   Renal/GU      Musculoskeletal   Abdominal   Peds  Hematology   Anesthesia Other Findings   Reproductive/Obstetrics                          Anesthesia Physical Anesthesia Plan  ASA: II  Anesthesia Plan: General   Post-op Pain Management:    Induction: Intravenous  Airway Management Planned: LMA  Additional Equipment:   Intra-op Plan:   Post-operative Plan: Extubation in OR  Informed Consent: I have reviewed the patients History and Physical, chart, labs and discussed the procedure including the risks, benefits and alternatives for the proposed anesthesia with the patient or authorized representative who has indicated his/her understanding and acceptance.     Plan Discussed with:   Anesthesia Plan Comments:         Anesthesia Quick Evaluation

## 2013-06-25 NOTE — Progress Notes (Signed)
Orthopedic Tech Progress Note Patient Details:  [name] 1960-10-28 [id number]  Patient ID: [name], female   DOB: [birth year], 52 y.o.   MRN: [id number]   [name] 06/25/2013, 1:30 PMDroped off arm sling to OR.

## 2013-06-25 NOTE — Anesthesia Procedure Notes (Signed)
Procedure Name: LMA Insertion Date/Time: 06/25/2013 11:57 AM Performed by: Marena Chancy Pre-anesthesia Checklist: Patient identified, Patient being monitored, Emergency Drugs available, Timeout performed and Suction available Patient Re-evaluated:Patient Re-evaluated prior to inductionOxygen Delivery Method: Circle system utilized Preoxygenation: Pre-oxygenation with 100% oxygen Intubation Type: IV induction LMA: LMA inserted LMA Size: 4.0 Placement Confirmation: breath sounds checked- equal and bilateral and positive ETCO2 Tube secured with: Tape Dental Injury: Teeth and Oropharynx as per pre-operative assessment

## 2013-06-25 NOTE — Anesthesia Postprocedure Evaluation (Signed)
  Anesthesia Post-op Note  Patient: [name]  Procedure(s) Performed: Procedure(s) with comments: ULNAR NERVE DECOMPRESSION/TRANSPOSITION (Left) - left ulnar nerve decompression CARPAL TUNNEL RELEASE (Left) - left carpal tunnel release  Patient Location: PACU  Anesthesia Type:General  Level of Consciousness: awake, oriented, sedated and patient cooperative  Airway and Oxygen Therapy: Patient Spontanous Breathing  Post-op Pain: mild  Post-op Assessment: Post-op Vital signs reviewed, Patient's Cardiovascular Status Stable, Respiratory Function Stable, Patent Airway, No signs of Nausea or vomiting and Pain level controlled  Post-op Vital Signs: stable  Complications: No apparent anesthesia complications

## 2013-06-25 NOTE — Discharge Summary (Signed)
Physician Discharge Summary  Patient ID: [name] MRN: [id number] DOB/AGE: 07/31/1960 52 y.o.  Admit date: 06/25/2013 Discharge date: 06/25/2013  Admission Diagnoses:  left carpal tunnel syndrome, left ulnar neuropathy  Discharge Diagnoses:  left carpal tunnel syndrome, left ulnar neuropathy  Active Problems:   Ulnar neuropathy  Discharged Condition: good  Hospital Course: Patient was admitted, underwent a left ulnar nerve decompression and left carpal tunnel release. Postoperatively she has done well. Good pain control. Dressings are clean and dry. She is up and ambulating actively. Patient has voided. She is asking to be discharged home. She is to return in 2 days for dressing change. She's been given instructions regarding wound care and activities.  Discharge Exam: Blood pressure 112/76, pulse 77, temperature 97.6 F (36.4 C), temperature source Oral, resp. rate 18, SpO2 96.00%.  Disposition: Home     Medication List         ALPRAZolam 1 MG 24 hr tablet  Commonly known as:  XANAX XR  Take 1 mg by mouth 2 (two) times daily as needed for anxiety.     aspirin 81 MG EC tablet  Take 81 mg by mouth daily.     BLACK COHOSH PO  Take 1 tablet by mouth daily.     CALTRATE 600+D PO  Take 1 tablet by mouth daily.     cetirizine 10 MG tablet  Commonly known as:  ZYRTEC  Take 10 mg by mouth daily.     famotidine 20 MG tablet  Commonly known as:  PEPCID  Take 20 mg by mouth every morning. May take 1 additional dose if needed     levothyroxine 125 MCG tablet  Commonly known as:  SYNTHROID, LEVOTHROID  Take 125 mcg by mouth daily before breakfast.     multivitamin with minerals tablet  Take 1 tablet by mouth daily.     OVER THE COUNTER MEDICATION  Take 1 tablet by mouth daily. *otc med called Estra tone*         Signed: Hewitt Shorts, MD 06/25/2013, 6:10 PM

## 2013-06-26 NOTE — Progress Notes (Signed)
UR review completed. 

## 2013-06-27 ENCOUNTER — Encounter (HOSPITAL_COMMUNITY): Payer: Self-pay | Admitting: Neurosurgery

## 2013-08-04 ENCOUNTER — Other Ambulatory Visit: Payer: Self-pay | Admitting: Internal Medicine

## 2013-10-15 ENCOUNTER — Other Ambulatory Visit: Payer: Self-pay

## 2013-10-15 MED ORDER — ALPRAZOLAM ER 1 MG PO TB24: ORAL_TABLET | ORAL | Status: DC

## 2013-10-15 NOTE — Telephone Encounter (Signed)
The patient's husband called and is hoping to get a refill of her xananx.  He is hoping to speak with the CMA about this medication refill   Husband Kathleen Ohm(Chris) [phone]

## 2013-10-15 NOTE — Telephone Encounter (Signed)
Dr Debby BudNorins, okay for refill?

## 2013-11-25 ENCOUNTER — Other Ambulatory Visit: Payer: Self-pay | Admitting: *Deleted

## 2013-11-25 MED ORDER — LEVOTHYROXINE SODIUM 125 MCG PO TABS: 125.0000 ug | ORAL_TABLET | Freq: Every day | ORAL | Status: DC

## 2013-11-27 ENCOUNTER — Other Ambulatory Visit (INDEPENDENT_AMBULATORY_CARE_PROVIDER_SITE_OTHER): Payer: Self-pay

## 2013-11-27 ENCOUNTER — Encounter: Payer: Self-pay | Admitting: Internal Medicine

## 2013-11-27 ENCOUNTER — Ambulatory Visit (INDEPENDENT_AMBULATORY_CARE_PROVIDER_SITE_OTHER): Payer: Self-pay | Admitting: Internal Medicine

## 2013-11-27 ENCOUNTER — Ambulatory Visit (INDEPENDENT_AMBULATORY_CARE_PROVIDER_SITE_OTHER)
Admission: RE | Admit: 2013-11-27 | Discharge: 2013-11-27 | Disposition: A | Discharge status: Home / Self Care | Payer: Self-pay | Source: Ambulatory Visit | Attending: Internal Medicine | Admitting: Internal Medicine

## 2013-11-27 VITALS — BP 128/90 | HR 90 | Temp 98.3°F | Resp 15 | Wt 211.0 lb

## 2013-11-27 DIAGNOSIS — J209 Acute bronchitis, unspecified: Secondary | ICD-10-CM

## 2013-11-27 DIAGNOSIS — F17200 Nicotine dependence, unspecified, uncomplicated: Secondary | ICD-10-CM

## 2013-11-27 DIAGNOSIS — E039 Hypothyroidism, unspecified: Secondary | ICD-10-CM

## 2013-11-27 DIAGNOSIS — F411 Generalized anxiety disorder: Secondary | ICD-10-CM

## 2013-11-27 LAB — CBC WITH DIFFERENTIAL/PLATELET
Basophils Absolute: 0.1 10*3/uL (ref 0.0–0.1)
Basophils Relative: 0.7 % (ref 0.0–3.0)
Eosinophils Absolute: 0.1 10*3/uL (ref 0.0–0.7)
Eosinophils Relative: 0.6 % (ref 0.0–5.0)
HCT: 45.5 % (ref 36.0–46.0)
Hemoglobin: 15.6 g/dL — ABNORMAL HIGH (ref 12.0–15.0)
Lymphocytes Relative: 35.5 % (ref 12.0–46.0)
Lymphs Abs: 3.5 10*3/uL (ref 0.7–4.0)
MCHC: 34.3 g/dL (ref 30.0–36.0)
MCV: 91.7 fl (ref 78.0–100.0)
Monocytes Absolute: 1.1 10*3/uL — ABNORMAL HIGH (ref 0.1–1.0)
Monocytes Relative: 11.6 % (ref 3.0–12.0)
Neutro Abs: 5.1 10*3/uL (ref 1.4–7.7)
Neutrophils Relative %: 51.6 % (ref 43.0–77.0)
Platelets: 423 10*3/uL — ABNORMAL HIGH (ref 150.0–400.0)
RBC: 4.96 Mil/uL (ref 3.87–5.11)
RDW: 13.4 % (ref 11.5–15.5)
WBC: 9.9 10*3/uL (ref 4.0–10.5)

## 2013-11-27 LAB — TSH: TSH: 2.04 u[IU]/mL (ref 0.35–4.50)

## 2013-11-27 MED ORDER — AZITHROMYCIN 250 MG PO TABS: ORAL_TABLET | ORAL | Status: AC

## 2013-11-27 MED ORDER — LEVOTHYROXINE SODIUM 125 MCG PO TABS: 125.0000 ug | ORAL_TABLET | Freq: Every day | ORAL | Status: AC

## 2013-11-27 MED ORDER — ALPRAZOLAM ER 1 MG PO TB24: ORAL_TABLET | ORAL | Status: AC

## 2013-11-27 MED ORDER — ALPRAZOLAM ER 1 MG PO TB24: ORAL_TABLET | ORAL | Status: DC

## 2013-11-27 NOTE — Patient Instructions (Addendum)
Plain Mucinex (NOT D) for thick secretions ;force NON dairy fluids .   Nasal cleansing in the shower as discussed with lather of mild shampoo.After 10 seconds wash off lather while  exhaling through nostrils. Make sure that all residual soap is removed to prevent irritation.  Flonase OR Nasacort AQ 1 spray in each nostril twice a day as needed. Use the "crossover" technique into opposite nostril spraying toward opposite ear @ 45 degree angle, not straight up into nostril.  Use a Neti pot daily only  as needed for significant sinus congestion; going from open side to congested side . Plain Allegra (NOT D )  160 daily , Loratidine 10 mg , OR Zyrtec 10 mg @ bedtime  as needed for itchy eyes & sneezing. Your next office appointment will be determined based upon review of your pending labs & x-rays. Those instructions will be transmitted to you through My Chart  OR  by mail.  Symbicort sample one - two inhalations every 12 hours; gargle and spit after use

## 2013-11-27 NOTE — Progress Notes (Signed)
   Subjective:    Patient ID: Kathleen ConradPenny A Palazzola, female    DOB: 06/21/1961, 53 y.o.   MRN: [id number]  HPI   She has had a cough since mid April. This  initially presented as a sore throat, rhinitis, and head/chest congestion. The cough has been nonproductive for the most part. She's also had associated itchy, watery eyes and sneezing  There was low-grade fever, chills, sweating  She believes she was exposed to sick family or friends. Additionally pollen may have been a factor  She is taking Delsym and Mucinex D with only partial response  At this time she has some frontal headache discomfort and facial pain. She also has dental discomfort and sore throat. There are some discolored nasal secretions  She noted discharge from the left ear.  Cough is dry except for scant secretions.The cough has been associated with wheezing and shortness of breath.    Review of Systems  She also is requesting a refill of her Xanax. She takes extended release form usually once a day. Occasionally she will not take it at all  She is also requesting a refill of or thyroid supplement. There's been no change in dose, brand, or manner of administration          Objective:   Physical Exam Positive physical findings included scarring of the right tympanic membrane inferiorly. She has low-grade wheezing present bilaterally but more prominent in the right lung. Her skin is damp to touch. She has scattered tattoos.  Gen.: Adequately nourished in appearance. Alert, appropriate and cooperative throughout exam. Appears younger than stated age  Head: Normocephalic without obvious abnormalities  Eyes: No corneal or conjunctival inflammation noted. No lid lag or proptosis  Nose: External nasal exam reveals no deformity or inflammation. Nasal mucosa are dry. No lesions or exudates noted.   Mouth: Oral mucosa and oropharynx reveal no lesions or exudates. Teeth in good repair. Neck: No deformities, masses, or  tenderness noted. Thyroid normal. Heart: Normal rate and rhythm. Normal S1 and S2. No gallop, click, or rub.no murmur.                                Musculoskeletal/extremities: No deformity or scoliosis noted of  the thoracic or lumbar spine.  No clubbing, cyanosis, edema, or significant extremity  deformity noted. Tone & strength normal. Hand joints normal Fingernail health good. Vascular: Carotid, radial artery pulses are full and equal. No bruits present. Neurologic: Alert and oriented x3.  Gait normal  Lymph: No cervical, axillary lymphadenopathy present. Psych: Mood and affect are normal. Normally interactive                                                                                        Assessment & Plan:  #1 Asthmatic bronchitis with cough present for one month rule out community acquired pneumonia in view of the asymmetry of the bronchospasm  #2 anxiety; Xanax refilled. Addictive potential of the medicine discussed  #3 hypothyroidism; thyroid function will be reassessed  #4 smoker, risk  discussed  See orders and recommendations

## 2013-11-27 NOTE — Progress Notes (Signed)
Pre visit review using our clinic review tool, if applicable. No additional management support is needed unless otherwise documented below in the visit note. 

## 2017-06-08 ENCOUNTER — Other Ambulatory Visit: Payer: Self-pay | Admitting: Obstetrics and Gynecology

## 2017-06-08 DIAGNOSIS — N63 Unspecified lump in unspecified breast: Secondary | ICD-10-CM

## 2017-06-11 ENCOUNTER — Other Ambulatory Visit (HOSPITAL_COMMUNITY): Payer: Self-pay | Admitting: *Deleted

## 2017-06-11 DIAGNOSIS — N6320 Unspecified lump in the left breast, unspecified quadrant: Secondary | ICD-10-CM

## 2017-06-21 ENCOUNTER — Ambulatory Visit
Admission: RE | Admit: 2017-06-21 | Discharge: 2017-06-21 | Disposition: A | Discharge status: Home / Self Care | Payer: No Typology Code available for payment source | Source: Ambulatory Visit | Attending: Obstetrics and Gynecology | Admitting: Obstetrics and Gynecology

## 2017-06-21 ENCOUNTER — Ambulatory Visit
Admission: RE | Admit: 2017-06-21 | Discharge: 2017-06-21 | Disposition: A | Discharge status: Home / Self Care | Payer: Self-pay | Source: Ambulatory Visit | Attending: Obstetrics and Gynecology | Admitting: Obstetrics and Gynecology

## 2017-06-21 ENCOUNTER — Ambulatory Visit (HOSPITAL_COMMUNITY)
Admission: RE | Admit: 2017-06-21 | Discharge: 2017-06-21 | Disposition: A | Discharge status: Home / Self Care | Payer: Self-pay | Source: Ambulatory Visit | Attending: Obstetrics and Gynecology | Admitting: Obstetrics and Gynecology

## 2017-06-21 ENCOUNTER — Encounter (HOSPITAL_COMMUNITY): Payer: Self-pay

## 2017-06-21 VITALS — BP 124/76 | Temp 97.2°F | Ht 70.0 in | Wt 199.4 lb

## 2017-06-21 DIAGNOSIS — N6320 Unspecified lump in the left breast, unspecified quadrant: Secondary | ICD-10-CM

## 2017-06-21 DIAGNOSIS — Z1239 Encounter for other screening for malignant neoplasm of breast: Secondary | ICD-10-CM

## 2017-06-21 DIAGNOSIS — N644 Mastodynia: Secondary | ICD-10-CM

## 2017-06-21 HISTORY — DX: Bipolar disorder, unspecified: F31.9

## 2017-06-21 NOTE — Progress Notes (Signed)
Complaints of left breast lump x 3 weeks. Patient complained of left breast pain where lump is located x 3 weeks that comes and goes. Patient states the pain is worse when touched. Patient rates the pain at a 6-7 out of 10.  Pap Smear: Pap smear not completed today. [facility] and normal with negative HPV per patient. Per patient has no history of an abnormal Pap smear. No Pap smear results are in Epic.  Physical exam: Breasts Breasts symmetrical. No skin abnormalities bilateral breasts. No nipple retraction bilateral breasts. No nipple discharge bilateral breasts. No lymphadenopathy. No lumps palpated bilateral breasts. Unable to palpate a lump in patient's area of concern. Complaints of let breast pain at 9 o'clock 12 cm from the nipple. [facility] for diagnostic mamm[facility] and possible left breast ultrasound. Appointment scheduled for Thursday, June 21, 2017 at 1050.        Pelvic/Bimanual No Pap smear completed today since last Pap smear and HPV typing was in 2016 per patient. Pap smear not indicated per B[facility]CP guidelines.   Smoking History: Patient is a current smoker. Discussed smoking cessation with patient. Referred to the [facility] and gave resources to free smoking cessation classes at [facility]  Patient Navigation: Patient education provided. Access to services provided for patient through B[facility]CP program.   Colorectal Cancer Screening: Per patient has never had a colonoscopy completed. No complaints today. FIT Test given to patient to complete and return to [facility].

## 2017-06-21 NOTE — Patient Instructions (Signed)
Explained breast self awareness with Kathleen SimmerPenny Ann Shannon. Patient did not need a Pap smear today due to last Pap smear and HPV typing was in 2016 per patient. Let her know B[facility]CP will cover Pap smears every 3 years or a Pap smear and HPV typing every 5 years unless has a history of abnormal Pap smears. [facility] for diagnostic mamm[facility] and possible left breast ultrasound. Appointment scheduled for Thursday, June 21, 2017 at 1050. Discussed smoking cessation with patient. Referred [facility] the [facility] and gave resources to free smoking cessation classes at [facility]. Kathleen PennerPenny Ann Shannon verbalized understanding.  [place], Kathleen Maserhristine Poll, RN 12:55 PM

## 2017-06-22 ENCOUNTER — Encounter (HOSPITAL_COMMUNITY): Payer: Self-pay | Admitting: *Deleted

## 2017-07-19 ENCOUNTER — Ambulatory Visit (HOSPITAL_COMMUNITY): Payer: Self-pay

## 2020-08-23 ENCOUNTER — Emergency Department (HOSPITAL_COMMUNITY)
Admission: EM | Admit: 2020-08-23 | Discharge: 2020-08-23 | Disposition: A | Discharge status: Home / Self Care | Payer: Medicare Other | Attending: Medical | Admitting: Medical

## 2020-08-23 ENCOUNTER — Emergency Department (HOSPITAL_BASED_OUTPATIENT_CLINIC_OR_DEPARTMENT_OTHER): Payer: Medicare Other

## 2020-08-23 ENCOUNTER — Other Ambulatory Visit: Payer: Self-pay

## 2020-08-23 DIAGNOSIS — Z5321 Procedure and treatment not carried out due to patient leaving prior to being seen by health care provider: Secondary | ICD-10-CM | POA: Insufficient documentation

## 2020-08-23 DIAGNOSIS — M79605 Pain in left leg: Secondary | ICD-10-CM | POA: Diagnosis present

## 2020-08-23 NOTE — ED Notes (Signed)
Pt ambulatory in lobby with her personal cell phone taking pictures. Advised by staff of policy that prohibits videos or photographs.

## 2020-08-23 NOTE — Progress Notes (Signed)
Lower extremity venous has been completed.   Preliminary results in CV Proc.   Blanch Media 08/23/2020 3:20 PM

## 2020-08-23 NOTE — ED Notes (Signed)
Patient family asking about results. I informed him, that I could not go over her results, only a Doctor could. He stated that she was leaving. He stated that he would call her doctor tomorrow.

## 2020-08-23 NOTE — ED Triage Notes (Signed)
Pt with LLE pain and redness x 2 days with hx of DVT here for DVT study. No anticoagulants. Sent from [facility] for further eval.

## 2023-08-03 ENCOUNTER — Other Ambulatory Visit: Payer: Self-pay | Admitting: Obstetrics and Gynecology

## 2023-08-03 DIAGNOSIS — Z1231 Encounter for screening mammogram for malignant neoplasm of breast: Secondary | ICD-10-CM

## 2023-09-18 ENCOUNTER — Ambulatory Visit: Payer: No Typology Code available for payment source

## 2023-11-15 ENCOUNTER — Ambulatory Visit
Admission: RE | Admit: 2023-11-15 | Discharge: 2023-11-15 | Disposition: A | Discharge status: Home / Self Care | Source: Ambulatory Visit | Attending: Obstetrics and Gynecology | Admitting: Obstetrics and Gynecology

## 2023-11-15 DIAGNOSIS — Z1231 Encounter for screening mammogram for malignant neoplasm of breast: Secondary | ICD-10-CM
